# Patient Record
Sex: Male | Born: 1945 | Race: Black or African American | Hispanic: No | Marital: Married | Smoking: Current every day smoker
Health system: Southern US, Community
[De-identification: ages and names within clinical notes are randomized; demographics above are authoritative.]

## PROBLEM LIST (undated history)

## (undated) DIAGNOSIS — J45909 Unspecified asthma, uncomplicated: Secondary | ICD-10-CM

## (undated) DIAGNOSIS — Z8489 Family history of other specified conditions: Secondary | ICD-10-CM

## (undated) DIAGNOSIS — R042 Hemoptysis: Secondary | ICD-10-CM

## (undated) HISTORY — PX: NO PAST SURGERIES: SHX2092

## (undated) HISTORY — DX: Unspecified asthma, uncomplicated: J45.909

---

## 2017-10-17 ENCOUNTER — Other Ambulatory Visit: Payer: Self-pay | Admitting: Internal Medicine

## 2017-10-17 ENCOUNTER — Ambulatory Visit
Admission: RE | Admit: 2017-10-17 | Discharge: 2017-10-17 | Disposition: A | Discharge status: Home / Self Care | Payer: Self-pay | Source: Ambulatory Visit | Attending: Internal Medicine | Admitting: Internal Medicine

## 2017-10-17 DIAGNOSIS — R7612 Nonspecific reaction to cell mediated immunity measurement of gamma interferon antigen response without active tuberculosis: Secondary | ICD-10-CM

## 2017-10-27 NOTE — Congregational Nurse Program (Signed)
Congregational Nurse Program Note  Date of Encounter: 10/27/2017  Past Medical History: No past medical history on file.  Encounter Details: CNP Questionnaire - 10/27/17 1422      Questionnaire   Patient Status  Refugee    Race  African    Location Patient Served At  Not Applicable    Insurance  Medicaid;Not Applicable    Food  No food insecurities    Housing/Utilities  Yes, have permanent housing    Transportation  Yes, need transportation assistance    Interpersonal Safety  Yes, feel physically and emotionally safe where you currently live    Medication  No medication insecurities    Medical Provider  Yes    Referrals  Not Applicable    ED Visit Averted  Not Applicable    Life-Saving Intervention Made  Not Applicable     Client came to the office to familiarize with congregational nurse and services offered. Mr Claudy has been in the [place] for a few months. He is complaining of a cough. He has history of Asthma and everyday tobacco user.He has been educated on asthma and tobacco use. Next appointment is scheduled for January 22nd at [facility]. Earlie Server Muhoro Rn BSN PCCN CNP  [phone]

## 2017-10-31 ENCOUNTER — Ambulatory Visit (INDEPENDENT_AMBULATORY_CARE_PROVIDER_SITE_OTHER): Payer: Medicaid Other | Admitting: Family

## 2017-10-31 ENCOUNTER — Ambulatory Visit: Payer: Medicaid Other | Attending: Nurse Practitioner | Admitting: Nurse Practitioner

## 2017-10-31 ENCOUNTER — Encounter: Payer: Self-pay | Admitting: Family

## 2017-10-31 DIAGNOSIS — R7612 Nonspecific reaction to cell mediated immunity measurement of gamma interferon antigen response without active tuberculosis: Secondary | ICD-10-CM

## 2017-10-31 NOTE — Assessment & Plan Note (Signed)
Todd Duncan presents with symptoms and imaging that are concerning for active pulmonary tuberculosis. He is a refugee from [place] which places him at high risk. He is instructed to wear a mask and we will collect 3 AFB sputum cultures to confirm diagnosis prior to treatment. He was advised if his symptoms worsen or he develops further fevers or night sweats he may need to be admitted to the hospital. He will plan to follow up in 1 week.

## 2017-10-31 NOTE — Progress Notes (Signed)
erroneous  This encounter was created in error - please disregard.

## 2017-10-31 NOTE — Patient Instructions (Addendum)
Nice to meet you!  Please continue to wear the mask until we have completed the lab work.   Please collect the sputum cultures one per day - in the morning if possible.   Please bring the sputum cultures to this office.  If unable to collect samples please let us know.  We will plan to see him back in about 1 week.   Tuberculosis Tuberculosis (TB) is an infection that harms body tissues. TB usually affects your lungs but can affect other parts of your body. There are two stages of TB:  Active TB. This means you have the symptoms of TB, and it can easily spread from person to person (contagious).  Latent TB. This means you do not have any symptoms of TB, and you are not contagious.  It is important to get treatment no matter what type of TB you have. Follow these instructions at home:  Take your antibiotic medicine as told by your doctor. Finish it all even if you start to feel better.  Keep all follow-up visits as told by your doctor. This is important.  Tell your doctor about all of the people you live with or with whom you have close contact. Your doctor or the Department of Health may talk to these people about being tested for TB.  Rest as needed.  Do not use any tobacco products, including cigarettes, chewing tobacco, or electronic cigarettes. If you need help quitting, ask your doctor.  Avoid close contact with others, especially infants and older people. Do this until your doctor says you will no longer spread TB.  Cover your mouth and nose when you cough or sneeze. Get rid of used tissues as told by your doctor.  Wash your hands often with soap and water.  Do not go back to work or school until your doctor says it is okay. Contact a doctor if:  You have new symptoms.  You are not hungry.  You feel sick to your stomach (nauseous) or throw up (vomit).  Your pee (urine) is dark yellow.  Your skin or the white part of your eyes turns a yellowish color.  Your  symptoms do not go away or get worse.  You have a fever. Get help right away if:  You have chest pain.  You cough up blood.  You have trouble breathing or feel short of breath.  You have a headache or stiff neck. This information is not intended to replace advice given to you by your health care provider. Make sure you discuss any questions you have with your health care provider. Document Released: 07/24/2009 Document Revised: 05/29/2016 Document Reviewed: 01/01/2014 Elsevier Interactive Patient Education  2018 [employer].

## 2017-10-31 NOTE — Progress Notes (Signed)
Subjective:    Patient ID: Todd Duncan, male    DOB: [birth year], 72 y.o.   MRN: [id number]  Chief Complaint  Patient presents with  . New Patient (Initial Visit)    TB     HPI:  Todd Duncan is a 72 year old male presenting today for positive Quantiferon Gold blood test and chest x-ray concerning for pulmonary tuberculosis.   Todd Duncan is a refugee from [place] in [place] who arrived into to the [place] in November of 2018. Todd Duncan was in usual state of health until approximately 2 months ago when Todd Duncan developed a cough. The cough is described as productive at times. Todd Duncan has had a couple of episodes of fever and night sweats over the course of the past month but not daily. Todd Duncan has had about 7 pounds of unintentional weight loss since the cough started. Was seen in the [facility] and [facility] following a positive Quantiferon Gold blood test. A follow up chest x-ray showed findings hight suspicious for pulmonary tuberculosis in bilateral upper lobes with unknown acuity; with hyperinflation and emphysematous changes with suspected large bulla in the periphery of both lungs. Todd Duncan was referred to ID for potential active TB.   No Known Allergies    Outpatient Medications Prior to Visit  Medication Sig Dispense Refill  . albuterol (PROVENTIL HFA;VENTOLIN HFA) 108 (90 Base) MCG/ACT inhaler Inhale into the lungs every 6 (six) hours as needed for wheezing or shortness of breath.     No facility-administered medications prior to visit.       History reviewed. No pertinent surgical history.   Past Medical History:  Diagnosis Date  . Asthma     Review of Systems  Constitutional: Positive for chills, fever and unexpected weight change.  HENT: Positive for congestion. Negative for facial swelling, rhinorrhea, sinus pressure, sinus pain, sneezing, sore throat and tinnitus.   Respiratory: Positive for cough. Negative for chest tightness,  shortness of breath and wheezing.   Cardiovascular: Negative for chest pain, palpitations and leg swelling.  Neurological: Negative for weakness and headaches.      Objective:    BP 122/72   Pulse 82   Temp (!) 97.4 F (36.3 C) (Oral)  Nursing note and vital signs reviewed.  Physical Exam  Constitutional: Todd Duncan is oriented to person, place, and time. Todd Duncan appears well-developed and well-nourished. Todd Duncan appears cachectic. No distress.  HENT:  Right Ear: Hearing, tympanic membrane, external ear and ear canal normal.  Left Ear: Hearing, tympanic membrane, external ear and ear canal normal.  Nose: Nose normal.  Cardiovascular: Normal rate, regular rhythm, normal heart sounds and intact distal pulses. Exam reveals no gallop and no friction rub.  No murmur heard. Pulmonary/Chest: Effort normal and breath sounds normal. No respiratory distress. Todd Duncan has no wheezes. Todd Duncan has no rales. Todd Duncan exhibits no tenderness.  Abdominal: Soft. Bowel sounds are normal. Todd Duncan exhibits no distension.  Neurological: Todd Duncan is alert and oriented to person, place, and time.  Skin: Skin is warm and dry.  Psychiatric: Todd Duncan has a normal mood and affect. His behavior is normal. Judgment and thought content normal.       Assessment & Plan:   Problem List Items Addressed This Visit      Other   Positive QuantiFERON-TB Gold test    Todd Duncan presents with symptoms and imaging that are concerning for active pulmonary tuberculosis. Todd Duncan is a refugee from [place] which places him at high risk. Todd Duncan  is instructed to wear a mask and we will collect 3 AFB sputum cultures to confirm diagnosis prior to treatment. Todd Duncan was advised if his symptoms worsen or Todd Duncan develops further fevers or night sweats Todd Duncan may need to be admitted to the hospital. Todd Duncan will plan to follow up in 1 week.       Relevant Orders    MYCOBACTERIA, CULTURE, WITH FLUOROCHROME SMEAR    MYCOBACTERIA, CULTURE, WITH FLUOROCHROME SMEAR    MYCOBACTERIA, CULTURE, WITH FLUOROCHROME  SMEAR      I am having Todd Duncan maintain his albuterol.   Follow-up: Return in about 1 week (around 11/07/2017).   [name], MSN, [facility] for Infectious Disease

## 2017-11-03 ENCOUNTER — Telehealth: Payer: Self-pay | Admitting: *Deleted

## 2017-11-03 NOTE — Telephone Encounter (Signed)
Marlowe Kays from the [employer] called, asked if patient's test results are back and if he is considered contagious/has active pulmonary tuberculosis.  RN advised that she would have to page the provider, as the patient did not yet bring back sputum cultures ordered by Marya Amsler. Marlowe Kays persisted, "I just want an answer if he is contagious or not for the safety of myself and my coworkers."  RN again advised that she would have to speak with Marya Amsler, as I could not answer her questions based on the notes/labs available. RN gave Marlowe Kays the provider's pager. Landis Gandy, RN

## 2017-11-03 NOTE — Telephone Encounter (Signed)
Noted and spoke with her.

## 2017-11-07 ENCOUNTER — Ambulatory Visit: Payer: Medicaid Other | Admitting: Family

## 2018-01-03 ENCOUNTER — Encounter (HOSPITAL_COMMUNITY): Payer: Self-pay | Admitting: Family Medicine

## 2018-01-03 ENCOUNTER — Other Ambulatory Visit: Payer: Self-pay

## 2018-01-03 ENCOUNTER — Ambulatory Visit (HOSPITAL_COMMUNITY)
Admission: EM | Admit: 2018-01-03 | Discharge: 2018-01-03 | Disposition: A | Discharge status: Home / Self Care | Payer: Medicaid Other | Attending: Family Medicine | Admitting: Family Medicine

## 2018-01-03 DIAGNOSIS — R059 Cough, unspecified: Secondary | ICD-10-CM

## 2018-01-03 DIAGNOSIS — J4521 Mild intermittent asthma with (acute) exacerbation: Secondary | ICD-10-CM

## 2018-01-03 DIAGNOSIS — R05 Cough: Secondary | ICD-10-CM

## 2018-01-03 MED ORDER — PREDNISONE 10 MG (21) PO TBPK: ORAL_TABLET | Freq: Every day | ORAL | 0 refills | Status: DC

## 2018-01-03 NOTE — ED Triage Notes (Signed)
Interpreter # [phone] Todd Duncan. Pt speaks swaili. C/o Hx of asthma with cough onset at "beginning of the month"

## 2018-01-03 NOTE — Congregational Nurse Program (Signed)
Congregational Nurse Program Note  Date of Encounter: 01/03/2018  Past Medical History: Past Medical History:  Diagnosis Date  . Asthma     Encounter Details: CNP Questionnaire - 01/03/18 1030      Questionnaire   Patient Status  Refugee    Race  African    Insurance  Medicaid    Uninsured  Not Applicable    Food  No food insecurities    Housing/Utilities  Yes, have permanent housing    Transportation  Yes, need transportation assistance    Interpersonal Safety  Yes, feel physically and emotionally safe where you currently live    Medication  Yes, have medication insecurities    Medical Provider  No    Referrals  Urgent Care    ED Visit Averted  Not Applicable    Life-Saving Intervention Made  Not Applicable      Office visit for this [place] man speaking [place] and including Swahili and [place], but no [place]. [employer] used. Very thin body frame. Initial concern was for eye exam and reading glasses. Vision 10/25 OU with 10 ft office chart. Reading glasses provided  Constantly coughing with rattling productive cough. Decreased lung sounds on right. History of latent TB previously managed at [facility] and due for a follow-up appointment now. Smokes several cigarettes per day. Unwilling to cease smoking. Refer to [facility] now and transportation provided by taxi.  Message left for appointment and follow-up with TB nurse at [facility], [phone]. Follow-up with nurse on Tuesday at [place]. Jannetta Quint, RN/CN

## 2018-01-09 NOTE — Congregational Nurse Program (Signed)
Congregational Nurse Program Note  Date of Encounter: 01/09/2018  Past Medical History: Past Medical History:  Diagnosis Date  . Asthma     Encounter Details: CNP Questionnaire - 01/09/18 1409      Questionnaire   Referrals  Other      Brief office visit and follow-up after referral to [facility]. Attending English classes at [employer].  Reviewed method of taking Prednisone descending dosage daily and use of inhaler. Very alert and attentive to information. Frequent coughing this am with production of mucous and rattling sound. Rhonchi of upper chest and bronchial area. Decreased lung sounds of upper right lung and normal left sounds. Reports productive cough during sleep also. Dressed very light for cold temperature outside and without a coat. Recommended dressing warmer. Assist securing warmer clothing through agency social worker. Information provided regarding appointment with [facility] TB [place] on 01-11-18 at 2:00 pm. Return to [place] nurse office on 01-20-18 to assist with transportation. Jannetta Quint, RN/CN

## 2018-01-11 NOTE — ED Provider Notes (Signed)
[place]   [id number] 01/03/18 Arrival Time: 2426  ASSESSMENT & PLAN:  1. Mild intermittent asthma with exacerbation   2. Cough    Nebulizer treatment needed: no.  Meds ordered this encounter  Medications  . predniSONE (STERAPRED UNI-PAK 21 TAB) 10 MG (21) TBPK tablet    Sig: Take by mouth daily. Take as directed.    Dispense:  21 tablet    Refill:  0   Asthma precautions given. OTC symptom care as needed. May f/u with PCP or here as needed.  Reviewed expectations re: course of current medical issues. Questions answered. Outlined signs and symptoms indicating need for more acute intervention. Patient verbalized understanding. After Visit Summary given.  SUBJECTIVE: History from: patient.  Todd Duncan is a 72 y.o. male who presents with complaint of intermittent wheezing. Triggers: None. Onset gradual, approximately on/off over the past few weeks. Describes wheezing as mild when present. Fever: no. Overall normal PO intake without n/v. Sick contacts: no. Typically his asthma is well controlled. Inhaler use: occasional. OTC treatment: none. No CP.  Social History   Tobacco Use  Smoking Status Former Smoker  Smokeless Tobacco Never Used    ROS: As per HPI.   OBJECTIVE:  Vitals:   01/03/18 1236  BP: 111/81  Pulse: 72  SpO2: 98%     General appearance: alert; appears fatigued HEENT: nasal congestion; clear runny nose; throat irritation secondary to post-nasal drainage Neck: supple without LAD Lungs: unlabored respirations, mild bilateral wheezing; cough: mild; no significant respiratory distress Skin: warm and dry Psychological: alert and cooperative; normal mood and affect   No Known Allergies  Past Medical History:  Diagnosis Date  . Asthma    No family history on file. Social History   Socioeconomic History  . Marital status: Married    Spouse name: Not on file  . Number of children: 10  . Years of education: Not on file  .  Highest education level: Not on file  Occupational History  . Not on file  Social Needs  . Financial resource strain: Not on file  . Food insecurity:    Worry: Never true    Inability: Never true  . Transportation needs:    Medical: Not on file    Non-medical: Not on file  Tobacco Use  . Smoking status: Former Research scientist (life sciences)  . Smokeless tobacco: Never Used  Substance and Sexual Activity  . Alcohol use: Yes    Frequency: Never    Comment: Occasional  . Drug use: No  . Sexual activity: Not on file  Lifestyle  . Physical activity:    Days per week: Not on file    Minutes per session: Not on file  . Stress: Not on file  Relationships  . Social connections:    Talks on phone: Not on file    Gets together: Not on file    Attends religious service: Not on file    Active member of club or organization: Not on file    Attends meetings of clubs or organizations: Not on file    Relationship status: Not on file  . Intimate partner violence:    Fear of current or ex partner: Not on file    Emotionally abused: Not on file    Physically abused: Not on file    Forced sexual activity: Not on file  Other Topics Concern  . Not on file  Social History Narrative  . Not on file     Vanessa Kick, MD  01/11/18 0916  

## 2018-02-20 NOTE — Congregational Nurse Program (Signed)
Congregational Nurse Program Note  Date of Encounter: 02/13/2018  Past Medical History: Past Medical History:  Diagnosis Date  . Asthma     Encounter Details: CNP Questionnaire - 02/13/18 1100      Questionnaire   Patient Status  Refugee    Race  African    Location Patient Served At  Not Applicable    Insurance  Medicaid    Uninsured  Not Applicable    Food  No food insecurities    Housing/Utilities  Yes, have permanent housing    Transportation  Yes, need transportation assistance    Interpersonal Safety  Yes, feel physically and emotionally safe where you currently live    Medication  Yes, have medication insecurities    Medical Provider  No    Referrals  Other    ED Visit Averted  Not Applicable    Life-Saving Intervention Made  Not Applicable      Brief office requesting information regarding immunization status for routine vaccinations. Also complained of pain in left lower back above hip. Afebrile. Currently treated for Latent Tuberculosis and admits to compliance with medication through [facility] clinic. General appearance is a very thin man recent from [place] as refuge. Very pleasant and knowlegeable communicating in [place] or Swahil. Confirmed with [place] clinic all vaccinations current. Eligible to apply for [employer] on August 24, 2018. Medicaid information unavailable today. Will bring card on next day to determine PCP assigned. Jannetta Quint, RN/CN

## 2018-03-07 ENCOUNTER — Ambulatory Visit (INDEPENDENT_AMBULATORY_CARE_PROVIDER_SITE_OTHER): Payer: Medicaid Other

## 2018-03-07 ENCOUNTER — Other Ambulatory Visit: Payer: Self-pay

## 2018-03-07 ENCOUNTER — Encounter (HOSPITAL_COMMUNITY): Payer: Self-pay | Admitting: Emergency Medicine

## 2018-03-07 ENCOUNTER — Ambulatory Visit (HOSPITAL_COMMUNITY)
Admission: EM | Admit: 2018-03-07 | Discharge: 2018-03-07 | Disposition: A | Discharge status: Home / Self Care | Payer: Medicaid Other | Attending: Family Medicine | Admitting: Family Medicine

## 2018-03-07 DIAGNOSIS — R0781 Pleurodynia: Secondary | ICD-10-CM

## 2018-03-07 DIAGNOSIS — K5900 Constipation, unspecified: Secondary | ICD-10-CM | POA: Diagnosis not present

## 2018-03-07 DIAGNOSIS — M25512 Pain in left shoulder: Secondary | ICD-10-CM

## 2018-03-07 MED ORDER — NAPROXEN 500 MG PO TABS: 500.0000 mg | ORAL_TABLET | Freq: Two times a day (BID) | ORAL | 0 refills | Status: DC
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
## 2018-03-07 NOTE — Congregational Nurse Program (Signed)
Congregational Nurse Program Note  Date of Encounter: 03/07/2018  Past Medical History: Past Medical History:  Diagnosis Date  . Asthma     Encounter Details: CNP Questionnaire - 03/07/18 1145      Questionnaire   Patient Status  Refugee    Race  African    Location Patient Served At  [place]  Medicaid    Uninsured  Not Applicable    Food  No food insecurities    Housing/Utilities  Yes, have permanent housing    Transportation  Yes, need transportation assistance    Interpersonal Safety  Yes, feel physically and emotionally safe where you currently live    Medication  No medication insecurities    Medical Provider  No    Referrals  Urgent Care    ED Visit Averted  Not Applicable    Life-Saving Intervention Made  Not Applicable       Office visit at [place] center for this [place] man in need of [place] or Swahili interpretation. Preference is Swahili. Complains of severe left sided pain in left ribs area and in left shoulder upon pressure or movement since three days. States "I feel like I was hit in the chest". " I feel weak". Denies history of related injury. Experiences pain in shoulder upon pressure at site. Left lung sounds decreased with respirations;congested sounds. Right lung sounds normal. Frequent non-productive coughing. Decreased coughing since last visit.  Very thin man with visible rib cage outline without shirt on. Weight decrease since original visit. Currently under treatment and followed by [facility] for LT; [phone]. Next clinic appointment per patient is 6/19. Medicaid information unavailable today for PCP referral; advised to return to nurse office to coordinate PCP appointment on 03/14/18 with Medicaid information. Jannetta Quint, RN/CN. [phone].
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
## 2018-03-27 NOTE — Congregational Nurse Program (Signed)
Congregational Nurse Program Note  Date of Encounter: 03/13/2018  Past Medical History: Past Medical History:  Diagnosis Date  . Asthma     Encounter Details: CNP Questionnaire - 03/13/18 1000      Questionnaire   Patient Status  Refugee    Race  African    Location Patient Served At  [place]  Medicaid    Uninsured  Not Applicable    Food  No food insecurities    Housing/Utilities  Yes, have permanent housing    Transportation  Yes, need transportation assistance    Interpersonal Safety  Yes, feel physically and emotionally safe where you currently live    Medication  No medication insecurities    Medical Provider  No    Referrals  Other    ED Visit Averted  Not Applicable    Life-Saving Intervention Made  Not Applicable       Brief visit for this non-English speaking man from the [place], communicating in [place] and [place].  Currently under treatment for chest muscle soreness. Reports taking Aleve as ordered and pain improving with medicine. 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 Referred to [facility] for evaluation and medication refill. Understands use of [employer] [facility] for [place]. Provided bus tickets. Return prn. Jannetta Quint, RN/CN

## 2018-04-07 ENCOUNTER — Other Ambulatory Visit: Payer: Self-pay

## 2018-04-07 ENCOUNTER — Emergency Department (HOSPITAL_COMMUNITY): Payer: Medicaid Other

## 2018-04-07 ENCOUNTER — Encounter (HOSPITAL_COMMUNITY): Payer: Self-pay | Admitting: Emergency Medicine

## 2018-04-07 ENCOUNTER — Inpatient Hospital Stay (HOSPITAL_COMMUNITY)
Admission: EM | Admit: 2018-04-07 | Discharge: 2018-04-13 | DRG: 167 | Disposition: A | Discharge status: Home / Self Care | Payer: Medicaid Other | Attending: Family Medicine | Admitting: Family Medicine

## 2018-04-07 DIAGNOSIS — D1809 Hemangioma of other sites: Secondary | ICD-10-CM | POA: Diagnosis not present

## 2018-04-07 DIAGNOSIS — E440 Moderate protein-calorie malnutrition: Secondary | ICD-10-CM | POA: Diagnosis present

## 2018-04-07 DIAGNOSIS — Z7952 Long term (current) use of systemic steroids: Secondary | ICD-10-CM | POA: Diagnosis not present

## 2018-04-07 DIAGNOSIS — R634 Abnormal weight loss: Secondary | ICD-10-CM | POA: Diagnosis not present

## 2018-04-07 DIAGNOSIS — R627 Adult failure to thrive: Secondary | ICD-10-CM | POA: Diagnosis not present

## 2018-04-07 DIAGNOSIS — A1801 Tuberculosis of spine: Secondary | ICD-10-CM | POA: Diagnosis not present

## 2018-04-07 DIAGNOSIS — J189 Pneumonia, unspecified organism: Secondary | ICD-10-CM

## 2018-04-07 DIAGNOSIS — D649 Anemia, unspecified: Secondary | ICD-10-CM | POA: Diagnosis not present

## 2018-04-07 DIAGNOSIS — E871 Hypo-osmolality and hyponatremia: Secondary | ICD-10-CM | POA: Diagnosis present

## 2018-04-07 DIAGNOSIS — Z79899 Other long term (current) drug therapy: Secondary | ICD-10-CM

## 2018-04-07 DIAGNOSIS — R918 Other nonspecific abnormal finding of lung field: Secondary | ICD-10-CM | POA: Diagnosis not present

## 2018-04-07 DIAGNOSIS — A319 Mycobacterial infection, unspecified: Secondary | ICD-10-CM | POA: Diagnosis present

## 2018-04-07 DIAGNOSIS — J4530 Mild persistent asthma, uncomplicated: Secondary | ICD-10-CM | POA: Diagnosis present

## 2018-04-07 DIAGNOSIS — M940 Chondrocostal junction syndrome [Tietze]: Secondary | ICD-10-CM | POA: Diagnosis present

## 2018-04-07 DIAGNOSIS — R7612 Nonspecific reaction to cell mediated immunity measurement of gamma interferon antigen response without active tuberculosis: Secondary | ICD-10-CM | POA: Diagnosis not present

## 2018-04-07 DIAGNOSIS — Z720 Tobacco use: Secondary | ICD-10-CM

## 2018-04-07 DIAGNOSIS — R042 Hemoptysis: Secondary | ICD-10-CM | POA: Diagnosis not present

## 2018-04-07 DIAGNOSIS — F17210 Nicotine dependence, cigarettes, uncomplicated: Secondary | ICD-10-CM | POA: Diagnosis present

## 2018-04-07 DIAGNOSIS — J440 Chronic obstructive pulmonary disease with acute lower respiratory infection: Secondary | ICD-10-CM | POA: Diagnosis present

## 2018-04-07 DIAGNOSIS — Z9889 Other specified postprocedural states: Secondary | ICD-10-CM

## 2018-04-07 DIAGNOSIS — J471 Bronchiectasis with (acute) exacerbation: Secondary | ICD-10-CM | POA: Diagnosis present

## 2018-04-07 DIAGNOSIS — J441 Chronic obstructive pulmonary disease with (acute) exacerbation: Secondary | ICD-10-CM | POA: Diagnosis present

## 2018-04-07 DIAGNOSIS — J181 Lobar pneumonia, unspecified organism: Secondary | ICD-10-CM | POA: Diagnosis not present

## 2018-04-07 DIAGNOSIS — Z8611 Personal history of tuberculosis: Secondary | ICD-10-CM | POA: Diagnosis not present

## 2018-04-07 DIAGNOSIS — R05 Cough: Secondary | ICD-10-CM | POA: Diagnosis not present

## 2018-04-07 DIAGNOSIS — A159 Respiratory tuberculosis unspecified: Secondary | ICD-10-CM | POA: Diagnosis not present

## 2018-04-07 DIAGNOSIS — J45909 Unspecified asthma, uncomplicated: Secondary | ICD-10-CM | POA: Diagnosis present

## 2018-04-07 HISTORY — DX: Hemoptysis: R04.2

## 2018-04-07 HISTORY — DX: Family history of other specified conditions: Z84.89

## 2018-04-07 LAB — COMPREHENSIVE METABOLIC PANEL
ALK PHOS: 56 U/L (ref 38–126)
ALT: 11 U/L (ref 0–44)
ANION GAP: 8 (ref 5–15)
AST: 18 U/L (ref 15–41)
Albumin: 2.7 g/dL — ABNORMAL LOW (ref 3.5–5.0)
BUN: 11 mg/dL (ref 8–23)
CALCIUM: 8.4 mg/dL — AB (ref 8.9–10.3)
CO2: 23 mmol/L (ref 22–32)
CREATININE: 0.85 mg/dL (ref 0.61–1.24)
Chloride: 98 mmol/L (ref 98–111)
Glucose, Bld: 97 mg/dL (ref 70–99)
Potassium: 4.1 mmol/L (ref 3.5–5.1)
Sodium: 129 mmol/L — ABNORMAL LOW (ref 135–145)
Total Bilirubin: 0.9 mg/dL (ref 0.3–1.2)
Total Protein: 7.9 g/dL (ref 6.5–8.1)

## 2018-04-07 LAB — BRAIN NATRIURETIC PEPTIDE: B Natriuretic Peptide: 105.5 pg/mL — ABNORMAL HIGH (ref 0.0–100.0)

## 2018-04-07 LAB — CBC WITH DIFFERENTIAL/PLATELET
ABS IMMATURE GRANULOCYTES: 0.1 10*3/uL (ref 0.0–0.1)
BASOS PCT: 0 %
Basophils Absolute: 0.1 10*3/uL (ref 0.0–0.1)
Eosinophils Absolute: 0.1 10*3/uL (ref 0.0–0.7)
Eosinophils Relative: 1 %
HCT: 34.7 % — ABNORMAL LOW (ref 39.0–52.0)
Hemoglobin: 10.9 g/dL — ABNORMAL LOW (ref 13.0–17.0)
Immature Granulocytes: 0 %
Lymphocytes Relative: 9 %
Lymphs Abs: 1 10*3/uL (ref 0.7–4.0)
MCH: 29.3 pg (ref 26.0–34.0)
MCHC: 31.4 g/dL (ref 30.0–36.0)
MCV: 93.3 fL (ref 78.0–100.0)
MONO ABS: 1.3 10*3/uL — AB (ref 0.1–1.0)
MONOS PCT: 12 %
NEUTROS ABS: 8.7 10*3/uL — AB (ref 1.7–7.7)
Neutrophils Relative %: 78 %
PLATELETS: 280 10*3/uL (ref 150–400)
RBC: 3.72 MIL/uL — ABNORMAL LOW (ref 4.22–5.81)
RDW: 13.5 % (ref 11.5–15.5)
WBC: 11.2 10*3/uL — ABNORMAL HIGH (ref 4.0–10.5)

## 2018-04-07 LAB — RAPID HIV SCREEN (HIV 1/2 AB+AG)
HIV 1/2 Antibodies: NONREACTIVE
HIV-1 P24 ANTIGEN - HIV24: NONREACTIVE

## 2018-04-07 LAB — PROTIME-INR
INR: 1.21
Prothrombin Time: 15.2 seconds (ref 11.4–15.2)

## 2018-04-07 LAB — I-STAT CG4 LACTIC ACID, ED: Lactic Acid, Venous: 0.85 mmol/L (ref 0.5–1.9)

## 2018-04-07 LAB — TROPONIN I: Troponin I: <0.03 ng/mL (ref <0.03)

## 2018-04-07 LAB — APTT: APTT: 37 s — AB (ref 24–36)

## 2018-04-07 MED ORDER — SODIUM CHLORIDE 0.9 % IV SOLN
1.0000 g | Freq: Once | INTRAVENOUS | Status: AC
  Administered 2018-04-07: 1 g via INTRAVENOUS
  Filled 2018-04-07: qty 10

## 2018-04-07 MED ORDER — ENOXAPARIN SODIUM 40 MG/0.4ML ~~LOC~~ SOLN
40.0000 mg | SUBCUTANEOUS | Status: DC
  Administered 2018-04-07 – 2018-04-12 (×6): 40 mg via SUBCUTANEOUS
  Filled 2018-04-07 (×6): qty 0.4

## 2018-04-07 MED ORDER — SODIUM CHLORIDE 0.9 % IV SOLN
500.0000 mg | Freq: Once | INTRAVENOUS | Status: AC
  Administered 2018-04-07: 500 mg via INTRAVENOUS
  Filled 2018-04-07: qty 500

## 2018-04-07 MED ORDER — IOHEXOL 300 MG/ML  SOLN
75.0000 mL | Freq: Once | INTRAMUSCULAR | Status: AC | PRN
  Administered 2018-04-07: 100 mL via INTRAVENOUS

## 2018-04-07 MED ORDER — SODIUM CHLORIDE 0.9 % IV SOLN
INTRAVENOUS | Status: DC
  Administered 2018-04-07 – 2018-04-13 (×13): via INTRAVENOUS

## 2018-04-07 MED ORDER — METHYLPREDNISOLONE SODIUM SUCC 125 MG IJ SOLR
125.0000 mg | Freq: Once | INTRAMUSCULAR | Status: AC
  Administered 2018-04-07: 125 mg via INTRAVENOUS
  Filled 2018-04-07: qty 2

## 2018-04-07 MED ORDER — ACETAMINOPHEN 325 MG PO TABS
650.0000 mg | ORAL_TABLET | Freq: Four times a day (QID) | ORAL | Status: DC
  Administered 2018-04-08 – 2018-04-13 (×22): 650 mg via ORAL
  Filled 2018-04-07 (×22): qty 2

## 2018-04-07 MED ORDER — ALBUTEROL SULFATE (2.5 MG/3ML) 0.083% IN NEBU
5.0000 mg | INHALATION_SOLUTION | Freq: Once | RESPIRATORY_TRACT | Status: AC
  Administered 2018-04-07: 5 mg via RESPIRATORY_TRACT
  Filled 2018-04-07: qty 6

## 2018-04-07 MED ORDER — SODIUM CHLORIDE 0.9 % IV BOLUS
500.0000 mL | Freq: Once | INTRAVENOUS | Status: AC
  Administered 2018-04-07: 500 mL via INTRAVENOUS

## 2018-04-07 MED ORDER — IBUPROFEN 200 MG PO TABS
200.0000 mg | ORAL_TABLET | Freq: Four times a day (QID) | ORAL | Status: DC
  Administered 2018-04-07 – 2018-04-12 (×21): 200 mg via ORAL
  Filled 2018-04-07 (×21): qty 1

## 2018-04-07 NOTE — ED Provider Notes (Signed)
[facility] EMERGENCY DEPARTMENT Provider Note   CSN: [id number] Arrival date & time: 04/07/18  1302     History   Chief Complaint Chief Complaint  Patient presents with  . Epistaxis  . Chest Pain    HPI Todd Duncan is a 72 y.o. male.  HPI Patient was originally from [place] and most recently moved from [place] 7 months ago presents with 3 weeks of coughing, right-sided rib pain, subjective fevers and chills, weight loss, shortness of breath.  States he has had blood in the sputum as well as episodic nosebleeds from the right nare for the past 3 days.  Patient is not aware of any of status.  States he has been to see infectious disease doctor regarding positive TB testing but was told he does not have active tuberculosis. Past Medical History:  Diagnosis Date  . Asthma     Patient Active Problem List   Diagnosis Date Noted  . 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 No pertinent surgical history.      Home Medications    Prior to Admission medications   Medication Sig Start Date End Date Taking? Authorizing Provider  albuterol (PROVENTIL HFA;VENTOLIN HFA) 108 (90 Base) MCG/ACT inhaler Inhale into the lungs every 6 (six) hours as needed for wheezing or shortness of breath.    [provider]  naproxen (NAPROSYN) 500 MG tablet Take 1 tablet (500 mg total) by mouth 2 (two) times daily. 03/07/18   Vanessa Kick, MD  predniSONE (STERAPRED UNI-PAK 21 TAB) 10 MG (21) TBPK tablet Take by mouth daily. Take as directed. 01/03/18   Vanessa Kick, MD    Family History Family History  Family history unknown: Yes    Social History Social History   Tobacco Use  . Smoking status: Former Research scientist (life sciences)  . Smokeless tobacco: Never Used  Substance Use Topics  . Alcohol use: Yes    Frequency: Never    Comment: Occasional  . Drug use: No     Allergies   Patient has no known allergies.   Review of Systems Review of Systems    Constitutional: Positive for chills, fatigue and fever.  HENT: Positive for nosebleeds. Negative for sore throat and trouble swallowing.   Eyes: Negative for visual disturbance.  Respiratory: Positive for cough, shortness of breath and wheezing.   Cardiovascular: Positive for chest pain. Negative for palpitations and leg swelling.  Gastrointestinal: Negative for abdominal pain, blood in stool, diarrhea, nausea and vomiting.  Genitourinary: Negative for dysuria, frequency and hematuria.  Musculoskeletal: Negative for back pain, myalgias, neck pain and neck stiffness.  Skin: Negative for rash and wound.  Neurological: Negative for dizziness, weakness, light-headedness, numbness and headaches.  All other systems reviewed and are negative.    Physical Exam Updated Vital Signs BP 112/70 (BP Location: Left Arm)   Pulse 87   Temp 97.7 F (36.5 C) (Oral)   Resp 18   SpO2 98%   Physical Exam  Constitutional: He is oriented to person, place, and time. He appears well-developed.  Cachectic appearing  HENT:  Head: Normocephalic and atraumatic.  Mouth/Throat: Oropharynx is clear and moist.  Dried blood in the right nare.  No obvious active bleeding.  Eyes: Pupils are equal, round, and reactive to light. EOM are normal.  Muddy sclera  Neck: Normal range of motion. Neck supple.  No meningismus.  No lymphadenopathy.  Cardiovascular: Normal rate and regular rhythm. Exam reveals no gallop and no friction rub.  No  murmur heard. Pulmonary/Chest: He has wheezes. He has rales.  Diminished breath sounds and rhonchi in the right base and left upper lobes.  Scattered expiratory wheezing.  Increased work of breathing.  Abdominal: Soft. Bowel sounds are normal. There is no tenderness. There is no rebound and no guarding.  Musculoskeletal: Normal range of motion. He exhibits no edema or tenderness.  No lower extremity swelling, asymmetry or tenderness.  Distal pulses intact.  Neurological: He is alert  and oriented to person, place, and time.  Moves all extremities without focal deficit.  Sensation intact.  Skin: Skin is warm and dry. Capillary refill takes less than 2 seconds. No rash noted. No erythema.  Psychiatric: He has a normal mood and affect. His behavior is normal.  Nursing note and vitals reviewed.    ED Treatments / Results  Labs (all labs ordered are listed, but only abnormal results are displayed) Labs Reviewed  CBC WITH DIFFERENTIAL/PLATELET - Abnormal; Notable for the following components:      Result Value   WBC 11.2 (*)    RBC 3.72 (*)    Hemoglobin 10.9 (*)    HCT 34.7 (*)    Neutro Abs 8.7 (*)    Monocytes Absolute 1.3 (*)    All other components within normal limits  COMPREHENSIVE METABOLIC PANEL - Abnormal; Notable for the following components:   Sodium 129 (*)    Calcium 8.4 (*)    Albumin 2.7 (*)    All other components within normal limits  BRAIN NATRIURETIC PEPTIDE - Abnormal; Notable for the following components:   B Natriuretic Peptide 105.5 (*)    All other components within normal limits  APTT - Abnormal; Notable for the following components:   aPTT 37 (*)    All other components within normal limits  CULTURE, BLOOD (ROUTINE X 2)  CULTURE, BLOOD (ROUTINE X 2)  TROPONIN I  PROTIME-INR  RAPID HIV SCREEN (HIV 1/2 AB+AG)  I-STAT CG4 LACTIC ACID, ED    EKG None  Radiology Ct Chest W Contrast  Result Date: 04/07/2018 CLINICAL DATA:  Productive cough with bloody sputum. History of tuberculosis. EXAM: CT CHEST WITH CONTRAST TECHNIQUE: Multidetector CT imaging of the chest was performed during intravenous contrast administration. CONTRAST:  137mL OMNIPAQUE IOHEXOL 300 MG/ML  SOLN COMPARISON:  Multiple prior chest x-rays, most recently from today. FINDINGS: Cardiovascular: Normal heart size. No pericardial effusion. Normal caliber thoracic aorta. No aortic dissection. No central pulmonary embolism. Mediastinum/Nodes: Borderline enlarged left hilar  and subcarinal lymph nodes, measuring 1.0 cm in short axis. No axillary lymphadenopathy. The thyroid gland, trachea, and esophagus demonstrate no significant findings. Lungs/Pleura: Volume loss in the left upper lobe. Bronchiectasis, architectural distortion, and bullous changes in both upper lobes. Prominent non-enhancing consolidation involving the majority of the left upper lobe with solid material filling the bullae at the left lung apex. Scattered peribronchovascular nodularity in the left lower lobe. Small right pleural effusion with right lower lobe ground-glass density and consolidation. Mild emphysema. Upper Abdomen: No acute abnormality. Musculoskeletal: No chest wall abnormality. No acute osseous findings. Small oval lucent lesion in the T3 vertebral body, nonspecific. IMPRESSION: 1. 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 2. Nonspecific 1 cm lucent lesion in the T3 vertebral body. Consider non-emergent bone scan or thoracic spine MRI with contrast for further evaluation. 3.  Emphysema (ICD10-J43.9). Electronically Signed   By: Orville Govern.D.  On: 04/07/2018 18:11   Dg Chest Portable 1 View  Result Date: 04/07/2018 CLINICAL DATA:  Persistent nose bleeds. Patient on latent TB medications. EXAM: PORTABLE CHEST 1 VIEW COMPARISON:  October 17, 2017 and Mar 07, 2018 FINDINGS: Cystic changes are seen in the apices, left greater than right. There is increased density over the left apex. There is a new infiltrate in the lateral right lung base. Volume loss on the left persists. No other interval changes. No pneumothorax. IMPRESSION: 1. New infiltrate in the lateral right lung base. 2. Cystic changes in the upper lobes correlate with the reported history of previous tuberculosis. 3. Increasing density in the left upper lobe could be positional or technical. A developing  infiltrate is not excluded. A PA and lateral chest x-ray could better evaluate. Electronically Signed   By: Dorise Bullion III M.D   On: 04/07/2018 14:07    Procedures Procedures (including critical care time)  Medications Ordered in ED Medications  azithromycin (ZITHROMAX) 500 mg in sodium chloride 0.9 % 250 mL IVPB (500 mg Intravenous New Bag/Given 04/07/18 1755)  albuterol (PROVENTIL) (2.5 MG/3ML) 0.083% nebulizer solution 5 mg (5 mg Nebulization Given 04/07/18 1550)  methylPREDNISolone sodium succinate (SOLU-MEDROL) 125 mg/2 mL injection 125 mg (125 mg Intravenous Given 04/07/18 1550)  sodium chloride 0.9 % bolus 500 mL (0 mLs Intravenous Stopped 04/07/18 1650)  cefTRIAXone (ROCEPHIN) 1 g in sodium chloride 0.9 % 100 mL IVPB (0 g Intravenous Stopped 04/07/18 1826)  iohexol (OMNIPAQUE) 300 MG/ML solution 75 mL (100 mLs Intravenous Contrast Given 04/07/18 1731)     Initial Impression / Assessment and Plan / ED Course  I have reviewed the triage vital signs and the nursing notes.  Pertinent labs & imaging results that were available during my care of the patient were reviewed by me and considered in my medical decision making (see chart for details).     Discussed with internal medicine residency service.  Will see patient in the emergency department and admit.  No active bleeding.  Patient does have bilateral pneumonia on x-ray with chronic changes of tuberculosis.  He is in a negative pressure room.  Final Clinical Impressions(s) / ED Diagnoses   Final diagnoses:  Multifocal pneumonia    ED Discharge Orders    None       Julianne Rice, MD 04/07/18 [phone]

## 2018-04-07 NOTE — ED Notes (Addendum)
Patient still in CT.

## 2018-04-07 NOTE — H&P (Addendum)
[facility] Admission History and Physical Service Pager: [phone]  Patient name: Todd Duncan Medical record number: [id number] Date of birth: 1946/03/09 Age: 72 y.o. Gender: male  Primary Care Provider: System, Pcp Not In Consultants: Infectious disease Code Status: Full  Chief Complaint: Chest wall pain and cough   Assessment and Plan: Todd Duncan is a 72 y.o. male presenting with R sided chest wall pain and cough productive of bloody sputum. PMH is significant for asthma and tobacco use.  Hemoptysis  possible active tuberculosis: Chest wall pain likely costochondritis secondary to frequent cough.  Fevers, chills, weight loss, cough productive of frank blood, and previous positive quantiferon gold TB test without treatment support diagnosis of active TB.  Vitals are wnl.  Exam positive for rhonchorous lung exam, thin appearance.  Labs significant for slight hyponatremia to 129, albumin of 2.7, WBC 11.2, Hgb 10.9.  CXR significant for new infiltrate in the lateral RLL, cystic changes in upper lobes, and increasing density in LUL.  CT chest with multilobar PNA in LUL and RLL with chronic changes in upper lobes consistent w/ chronic TB, 1 cm lucent lesion in T3 vertebral body, and emphysema.  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  Called Dr. Tommy Medal of ID to discuss patient, and he is concerned that patient has active TB.  He was supposed to follow up with ID after first seeing them in January 2019 but did not.  Language barrier could have contributed to this lack of follow up.  Was given ceftriaxone and azithromycin for presumed CAP in the ED. - admit to med-surg, attending Dr. Erin Hearing - airborne precautions - ID consulted, appreciate recommendations - f/u blood cultures - AFB smear of sputum - consider continuing CTX and azithromycin, may need to change therapy - tylenol 650 mg Q6H, ibuprofen 200 mg Q6H alternating for costochondritis (not PRN  d/t language barrier)  Hyponatremia: sodium of 129 on admission - NS @ 100 ml/hr  Emphysema: patient told he has asthma, but he likely has emphysema given changes seen on CXR and CT and tobacco use history. - may benefit from Spiriva on discharge  Tobacco use: patient smokes about 4 cigarettes per day.  Used to smoke more, has a 50 year smoking history. - nicotine patch on request  FEN/GI: regular diet Prophylaxis: lovenox  Disposition: home  History of Present Illness:  Todd Duncan is a 72 y.o. male presenting with fevers, poor appetite with weight loss, hemoptysis for the past three weeks.  He says that he has had hemoptysis since 2014 off and on but was told that he had a virus and not TB at that time.  He was seen in the ID clinic in January 2019 due to a positive quantiferon gold result and sputum cultures were ordered, but patient did not follow up.  He reports that he was told he had TB but did not need treatment at that time.  He was then seen in March in the ED and was told he had asthma.  Since his symptoms have worsened over the past three weeks, he decided to come to the ED today.    He lived in the [place] until 1996, when he immigrated to [place] as a refugee due to political unrest.  He arrived in the [place] seven months ago.  He speaks [place] and [place].  He smokes about four cigarettes per day and drinks beer occasionally on weekends, up to a six pack on the weekend.  Review Of Systems: Per HPI with the following additions:   Review of Systems  Constitutional: Positive for chills, fever, malaise/fatigue and weight loss.  HENT: Negative for congestion and sore throat.   Eyes: Negative for blurred vision and double vision.  Respiratory: Positive for cough, hemoptysis, sputum production, shortness of breath and wheezing.   Cardiovascular: Positive for chest pain. Negative for leg swelling.  Gastrointestinal: Negative for abdominal pain, blood in stool, diarrhea, nausea  and vomiting.  Genitourinary: Negative for dysuria and urgency.  Musculoskeletal: Negative for back pain, myalgias and neck pain.  Skin: Negative for itching and rash.  Neurological: Negative for dizziness, weakness and headaches.  Psychiatric/Behavioral: Negative for substance abuse. The patient is not nervous/anxious.     Patient Active Problem List   Diagnosis Date Noted  . Tuberculosis 04/07/2018  . 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 Asthma     Past Surgical History: History reviewed. No pertinent surgical history.  Social History: Social History   Tobacco Use  . Smoking status: Former Research scientist (life sciences)  . Smokeless tobacco: Never Used  Substance Use Topics  . Alcohol use: Yes    Frequency: Never    Comment: Occasional  . Drug use: No    Family History: Family History  Family history unknown: Yes    Allergies and Medications: No Known Allergies No current facility-administered medications on file prior to encounter.    Current Outpatient Medications on File Prior to Encounter  Medication Sig Dispense Refill  . albuterol (PROVENTIL HFA;VENTOLIN HFA) 108 (90 Base) MCG/ACT inhaler Inhale into the lungs every 6 (six) hours as needed for wheezing or shortness of breath.    . naproxen (NAPROSYN) 500 MG tablet Take 1 tablet (500 mg total) by mouth 2 (two) times daily. 30 tablet 0  . predniSONE (STERAPRED UNI-PAK 21 TAB) 10 MG (21) TBPK tablet Take by mouth daily. Take as directed. 21 tablet 0    Objective: BP 112/70 (BP Location: Left Arm)   Pulse 87   Temp 97.7 F (36.5 C) (Oral)   Resp 18   SpO2 98%  Physical Exam  Constitutional: He is oriented to person, place, and time. He appears ill. No distress.  Very thin  HENT:  Head: Normocephalic and atraumatic.  Eyes: Pupils are equal, round, and reactive to light. EOM are normal.  Cardiovascular: Normal rate, regular rhythm and intact distal  pulses.  No murmur heard. Pulmonary/Chest: Effort normal. He has decreased breath sounds. He has no wheezes. He has rhonchi in the right upper field, the right middle field, the right lower field, the left upper field, the left middle field and the left lower field. He has no rales.  Abdominal: Soft. Bowel sounds are normal.  Musculoskeletal: Normal range of motion.  Neurological: He is alert and oriented to person, place, and time.  Skin: Skin is warm and dry.     Labs and Imaging: CBC BMET  Recent Labs  Lab 04/07/18 1547  WBC 11.2*  HGB 10.9*  HCT 34.7*  PLT 280   Recent Labs  Lab 04/07/18 1547  NA 129*  K 4.1  CL 98  CO2 23  BUN 11  CREATININE 0.85  GLUCOSE 97  CALCIUM 8.4*     6/29 Blood cultures x2 pending 6/29 BNP 105.5 6/29 Troponin I negative x1 6/29 INR 1.21 6/29 aPPT 37 6/29 HIV non-reactive 6/29 I-STAT lactic acid 0.85  CT CHEST WITH CONTRAST (04/07/2018) 1.  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 2. Nonspecific 1 cm lucent lesion in the T3 vertebral body. Consider non-emergent bone scan or thoracic spine MRI with contrast for further evaluation. 3.  Emphysema (ICD10-J43.9).  PORTABLE CHEST 1 VIEW (04/07/2018) 1. New infiltrate in the lateral right lung base. 2. Cystic changes in the upper lobes correlate with the reported history of previous tuberculosis. 3. Increasing density in the left upper lobe could be positional or technical. A developing infiltrate is not excluded. A PA and lateral chest x-ray could better evaluate.  Kathrene Alu, MD 04/07/2018, 7:10 PM PGY-1, [place] Intern pager: [phone], text pages welcome  Upper Level Addendum: I have seen and evaluated this patient along with Dr. Shan Levans and reviewed the above note, making necessary revisions in blue.  Harriet Butte, [place], PGY-2

## 2018-04-07 NOTE — ED Notes (Signed)
Report given.

## 2018-04-07 NOTE — ED Triage Notes (Signed)
Pt presents to ED for assessment of persistent nosebleeds x 3 days, spontaneous, lasting 5 minutes at a time.  Pt speaks [place] and [place].  Notes from congregational nurse states patient to follow up on latent TB medications.  Patient verbalizes no understanding of that at this time.  Patient has wet cough in triage, states red bloody sputum.

## 2018-04-08 DIAGNOSIS — A1801 Tuberculosis of spine: Secondary | ICD-10-CM

## 2018-04-08 DIAGNOSIS — R634 Abnormal weight loss: Secondary | ICD-10-CM

## 2018-04-08 DIAGNOSIS — R627 Adult failure to thrive: Secondary | ICD-10-CM

## 2018-04-08 DIAGNOSIS — A159 Respiratory tuberculosis unspecified: Secondary | ICD-10-CM

## 2018-04-08 LAB — CBC
HCT: 32 % — ABNORMAL LOW (ref 39.0–52.0)
Hemoglobin: 10.1 g/dL — ABNORMAL LOW (ref 13.0–17.0)
MCH: 29.2 pg (ref 26.0–34.0)
MCHC: 31.6 g/dL (ref 30.0–36.0)
MCV: 92.5 fL (ref 78.0–100.0)
PLATELETS: 265 10*3/uL (ref 150–400)
RBC: 3.46 MIL/uL — ABNORMAL LOW (ref 4.22–5.81)
RDW: 13.4 % (ref 11.5–15.5)
WBC: 14.9 10*3/uL — AB (ref 4.0–10.5)

## 2018-04-08 LAB — COMPREHENSIVE METABOLIC PANEL
ALK PHOS: 54 U/L (ref 38–126)
ALT: 9 U/L (ref 0–44)
ANION GAP: 5 (ref 5–15)
AST: 16 U/L (ref 15–41)
Albumin: 2.2 g/dL — ABNORMAL LOW (ref 3.5–5.0)
BILIRUBIN TOTAL: 0.4 mg/dL (ref 0.3–1.2)
BUN: 13 mg/dL (ref 8–23)
CALCIUM: 8.2 mg/dL — AB (ref 8.9–10.3)
CO2: 25 mmol/L (ref 22–32)
Chloride: 105 mmol/L (ref 98–111)
Creatinine, Ser: 0.71 mg/dL (ref 0.61–1.24)
GFR calc Af Amer: >60 mL/min (ref >60)
GLUCOSE: 127 mg/dL — AB (ref 70–99)
Potassium: 4 mmol/L (ref 3.5–5.1)
Sodium: 135 mmol/L (ref 135–145)
TOTAL PROTEIN: 7 g/dL (ref 6.5–8.1)

## 2018-04-08 MED ORDER — NICOTINE 7 MG/24HR TD PT24
7.0000 mg | MEDICATED_PATCH | Freq: Every day | TRANSDERMAL | Status: DC
  Administered 2018-04-08 – 2018-04-13 (×6): 7 mg via TRANSDERMAL
  Filled 2018-04-08 (×6): qty 1

## 2018-04-08 NOTE — Progress Notes (Signed)
Family Medicine Teaching Service Daily Progress Note Intern Pager: [phone]  Patient name: Todd Duncan Medical record number: [id number] Date of birth: 1946/04/10 Age: 72 y.o. Gender: male  Primary Care Provider: System, Pcp Not In Consultants: Infectious disease Code Status: Full  Pt Overview and Major Events to Date:  Todd Duncan is a 72 y.o. male presenting with R sided chest wall pain and cough productive of bloody sputum. PMH is significant for asthma and tobacco use.  Assessment and Plan:  Hemoptysis  possible active tuberculosis: History of fevers, chills, weight loss, cough productive of frank blood, and previous positive quantiferon gold TB test without treatment support diagnosis of active TB. CXR was significant for new infiltrate in the lateral RLL, cystic changes in upper lobes, and increasing density in LUL. CT chest with multilobar PNA in LUL and RLL with chronic changes in upper lobes consistent w/ chronic TB, 1 cm lucent lesion in T3 vertebral body, and emphysema.  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  Vitals are wnl again this am (6/30). S/p ceftriaxone and azithromycin for presumed CAP in the ED. - airborne precautions - ID consulted, appreciate recommendations - f/u blood cultures - AFB smear of sputum - consider continuing CTX and azithromycin, may need to change therapy - tylenol 650 mg Q6H, ibuprofen 200 mg Q6H alternating for costochondritis (not PRN d/t language barrier)  Hyponatremia: Resolved. Sodium of 129 on admission, corrected this am to 135. Likely low due to extra-sensory loses due to active infection. - Cont NS @ 100 ml/hr  Emphysema: patient told he has asthma, but he likely has emphysema given changes seen on CXR and CT and tobacco use history. - may benefit from Spiriva on discharge  Tobacco use: patient smokes about 4 cigarettes per day.  Used to smoke more, has a 50 year smoking history. - Nicotine patch daily  FEN/GI: reg  diet PPx: lovenox  Disposition: home  Subjective:  Patient reports on chest pain this am. He states he is breathing well and has no complaints this morning. His food tray was empty, patient has good appetite and is not having difficulty breathing.  Objective: Temp:  [97.4 F (36.3 C)-98.4 F (36.9 C)] 97.4 F (36.3 C) (06/30 0416) Pulse Rate:  [51-87] 70 (06/30 0416) Resp:  [16-18] 18 (06/30 0416) BP: (94-116)/(59-75) 97/72 (06/30 0416) SpO2:  [94 %-100 %] 100 % (06/30 0416) Physical Exam: General: Alert & Oriented; Ill-appearing, no distress Cardiovascular: RRR, no murmurs or gallops, normal S1, S2 split Respiratory: diffuse rhonchi bilaterally in all lung fields, poor air movement, no wheezing Abdomen: soft, non-tender, non-distended, +bs Extremities: no cyanosis or edema Skin: warm and dry  Laboratory: Recent Labs  Lab 04/07/18 1547 04/08/18 0841  WBC 11.2* 14.9*  HGB 10.9* 10.1*  HCT 34.7* 32.0*  PLT 280 265   Recent Labs  Lab 04/07/18 1547 04/08/18 0841  NA 129* 135  K 4.1 4.0  CL 98 105  CO2 23 25  BUN 11 13  CREATININE 0.85 0.71  CALCIUM 8.4* 8.2*  PROT 7.9 7.0  BILITOT 0.9 0.4  ALKPHOS 56 54  ALT 11 9  AST 18 16  GLUCOSE 97 127*   6/29 Lactic Acid 0.85 6/29 Blood cultures x2 pending 6/29 BNP 105.5 6/29 Troponin I negative x1 6/29 INR 1.21 6/29 aPPT 37 6/29 HIV non-reactive 6/29 AFB smear - needs collecting 6/29 Acid Fast Culture w/sensitivities - needs collecting  Imaging/Diagnostic Tests: CT CHEST WITH CONTRAST (04/07/2018) 1. Multilobar pneumonia involving the left  upper and right lower lobes superimposed on chronic changes in the bilateral upper lobes related to chronic mycobacterial infection. 2. Nonspecific 1 cm lucent lesion in the T3 vertebral body. Consider non-emergent bone scan or thoracic spine MRI with contrast for further evaluation. 3. Emphysema (ICD10-J43.9).  PORTABLE CHEST 1 VIEW (04/07/2018) 1. New infiltrate in the  lateral right lung base. 2. Cystic changes in the upper lobes correlate with the reported history of previous tuberculosis. 3. Increasing density in the left upper lobe could be positional or technical. A developing infiltrate is not excluded. A PA and lateral chest x-ray could better evaluate.  Nuala Alpha, DO 04/08/2018, 7:39 AM PGY-1, [place] Intern pager: [phone], text pages welcome

## 2018-04-08 NOTE — Consult Note (Signed)
Date of Admission:  04/07/2018          Reason for Consult: Pulmonary tuberculosis   Referring Provider: Dr. Erin Hearing   Assessment:  1. 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 Likely missed diagnosis of asthma 1 year previously in [place]:  1. Obtain sputum every 8 hours to be sent for AFB stain and culture 2. If above sputum are unrevealing consider consultation with pulmonary critical care to obtain bronchoscopy for BAL and AFB cultures 3. Would get MRI of spine 4. Would consider IR guided aspirate of spine so that sample could be sent for AFB cultures, bacterial cultures  5. 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 The [place] department needs to be notified on Monday 7. The health department will have to do testing with his immediate family members 55. I will also get in touch with Dr. Baxter Flattery from an infection prevention standpoint since this patient has interacted with healthcare system since January visit to [facility] including an  ER visit in March prior to his current admission 9. Ensure HIV test done  Active Problems:   Tuberculosis   Scheduled Meds: . acetaminophen  650 mg Oral Q6H  . enoxaparin (LOVENOX) injection  40 mg Subcutaneous Q24H  . ibuprofen  200 mg Oral QID  . nicotine  7 mg Transdermal Daily   Continuous Infusions: . sodium chloride 100 mL/hr at 04/08/18 1121   PRN Meds:.  HPI: Todd Duncan is a 72 y.o. male man from [place] who speaks largely Sw[place]ili with only little bit of [place].  He tells me that one year ago while he was living in [place] he developed a chronic cough.  He tells me that they had worked him up for tuberculosis but could find no evidence of tuberculosis while he was in [place] and instead diagnosed him with asthma.  I find this letter diagnosis very curious in a man who is 72 years of age and never had such problems before.  Apparently since his moved to  the [place] 6 months ago he has been seen in late fall early winter by the [facility] community wellness after he been found to have a positive QuantiFERON blood test and a chest x-ray that was suspicious for pulmonary tuberculosis bilateral infiltrates in his upper lobes seen on October 17, 2017.  He was referred to the regional center for infectious disease and seen by our nurse petitioner [name].  [name] instructed the patient to not go back to work and to wear a mask at all times when in public the plan was to obtain 3 sputum for AFB cultures and to initiate for drug treatment for TB.  Since that time however none of the above things seem to have occurred.  I do not know what happened or why the patient did not follow-up.  But he received no interceding treatment.  In fact he was seen in the emergency department on 27 March for a cough and diagnosed with mild intermittent asthma.  Unfortunately it does not appear that anyone had looked back in the chart to see that he was being worked up for active pulmonary tuberculosis and that he had a chest x-ray that was read by radiology as being "highly suspicious for pulmonary tuberculosis.  He has remained untreated in the intervening months now right through into late June and nearly July.  Now came  back to the emergency department with right-sided chest wall pain and a cough productive of bloody sputum.  He endorses cough as mentioned going back a year which is worsened recently with production of frank blood.  He has lost about 7 to 10 kg he states.  He has had fevers chills and drenching night sweats.  On admission here he had a CT scan of the lungs performed which showed   1. 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 2. Nonspecific 1 cm lucent lesion in the T3 vertebral  body.  Been placed in airborne precautions and AFB sputum cultures have been ordered.  HIV test is negative.  We need to get sputum sent off for AFB stain and culture and if these are not revealing consider bronchoscopy for BAL to be sent for culture.  I will also get an MRI of his spine.  We could consider IR guided sampling of the spine to look for AFB cultures and bacterial cultures.   Dr. Megan Salon will take over the service tomorrow.   Review of Systems: Review of Systems  Constitutional: Positive for chills, diaphoresis, fever, malaise/fatigue and weight loss.  HENT: Negative for congestion, hearing loss, sore throat and tinnitus.   Eyes: Negative for blurred vision and double vision.  Respiratory: Positive for cough, hemoptysis, sputum production and shortness of breath. Negative for wheezing.   Cardiovascular: Positive for chest pain and palpitations. Negative for leg swelling.  Gastrointestinal: Negative for abdominal pain, blood in stool, constipation, diarrhea, heartburn, melena, nausea and vomiting.  Genitourinary: Negative for dysuria, flank pain and hematuria.  Musculoskeletal: Negative for back pain, falls, joint pain and myalgias.  Skin: Negative for itching and rash.  Neurological: Negative for dizziness, sensory change, focal weakness, loss of consciousness, weakness and headaches.  Endo/Heme/Allergies: Does not bruise/bleed easily.  Psychiatric/Behavioral: Negative for depression, memory loss and suicidal ideas. The patient is not nervous/anxious.     Past Medical History:  Diagnosis Date  . Asthma     Social History   Tobacco Use  . Smoking status: Former Research scientist (life sciences)  . Smokeless tobacco: Never Used  Substance Use Topics  . Alcohol use: Yes    Frequency: Never    Comment: Occasional  . Drug use: No    Family History  Family history unknown: Yes   No Known Allergies  OBJECTIVE: Blood pressure 103/64, pulse 74, temperature 97.7 F (36.5 C),  temperature source Oral, resp. rate 16, SpO2 100 %.  Physical Exam  Constitutional: He is oriented to person, place, and time. He is cooperative. He appears ill. No distress.  HENT:  Head: Normocephalic and atraumatic.  Right Ear: Hearing and external ear normal.  Left Ear: Hearing and external ear normal.  Nose: No rhinorrhea or nasal deformity. No epistaxis.  Eyes: Pupils are equal, round, and reactive to light. Conjunctivae and EOM are normal. Right conjunctiva is not injected. Left conjunctiva is not injected. No scleral icterus.  Neck: Normal range of motion. Neck supple. No JVD present.  Cardiovascular: Normal rate, regular rhythm, S1 normal, S2 normal and normal heart sounds. Exam reveals no friction rub.  No murmur heard. Pulmonary/Chest: Effort normal. No respiratory distress. He has rales.  Abdominal: Soft. Normal appearance and bowel sounds are normal. He exhibits no distension and no ascites. There is no hepatosplenomegaly. There is no tenderness.  Musculoskeletal: Normal range of motion.       Right  shoulder: Normal.       Left shoulder: Normal.       Right hip: Normal.       Left hip: Normal.       Right knee: Normal.       Left knee: Normal.  Lymphadenopathy:       Head (right side): No submandibular, no preauricular and no posterior auricular adenopathy present.       Head (left side): No submandibular, no preauricular and no posterior auricular adenopathy present.    He has no cervical adenopathy.       Right cervical: No superficial cervical and no deep cervical adenopathy present.      Left cervical: No superficial cervical and no deep cervical adenopathy present.  Neurological: He is alert and oriented to person, place, and time. He has normal strength. No sensory deficit. Coordination and gait normal.  Skin: Skin is warm, dry and intact. No abrasion, no bruising, no ecchymosis, no lesion and no rash noted. He is not diaphoretic. No cyanosis or erythema. No pallor.  Nails show no clubbing.  Psychiatric: He has a normal mood and affect. His speech is normal and behavior is normal. Judgment and thought content normal. Cognition and memory are normal. He is attentive.    Lab Results Lab Results  Component Value Date   WBC 14.9 (H) 04/08/2018   HGB 10.1 (L) 04/08/2018   HCT 32.0 (L) 04/08/2018   MCV 92.5 04/08/2018   PLT 265 04/08/2018    Lab Results  Component Value Date   CREATININE 0.71 04/08/2018   BUN 13 04/08/2018   NA 135 04/08/2018   K 4.0 04/08/2018   CL 105 04/08/2018   CO2 25 04/08/2018    Lab Results  Component Value Date   ALT 9 04/08/2018   AST 16 04/08/2018   ALKPHOS 54 04/08/2018   BILITOT 0.4 04/08/2018     Microbiology: No results found for this or any previous visit (from the past 240 hour(s)).  Alcide Evener, [facility] for [facility] [phone] pager  04/08/2018, 2:31 PM

## 2018-04-09 ENCOUNTER — Encounter (HOSPITAL_COMMUNITY): Payer: Self-pay

## 2018-04-09 LAB — BASIC METABOLIC PANEL
Anion gap: 7 (ref 5–15)
BUN: 11 mg/dL (ref 8–23)
CO2: 23 mmol/L (ref 22–32)
CREATININE: 0.64 mg/dL (ref 0.61–1.24)
Calcium: 7.9 mg/dL — ABNORMAL LOW (ref 8.9–10.3)
Chloride: 110 mmol/L (ref 98–111)
GFR calc Af Amer: >60 mL/min (ref >60)
GFR calc non Af Amer: >60 mL/min (ref >60)
GLUCOSE: 88 mg/dL (ref 70–99)
POTASSIUM: 3.8 mmol/L (ref 3.5–5.1)
SODIUM: 140 mmol/L (ref 135–145)

## 2018-04-09 LAB — CBC
HEMATOCRIT: 28.4 % — AB (ref 39.0–52.0)
Hemoglobin: 9.1 g/dL — ABNORMAL LOW (ref 13.0–17.0)
MCH: 29.8 pg (ref 26.0–34.0)
MCHC: 32 g/dL (ref 30.0–36.0)
MCV: 93.1 fL (ref 78.0–100.0)
PLATELETS: 250 10*3/uL (ref 150–400)
RBC: 3.05 MIL/uL — ABNORMAL LOW (ref 4.22–5.81)
RDW: 13.5 % (ref 11.5–15.5)
WBC: 10 10*3/uL (ref 4.0–10.5)

## 2018-04-09 LAB — HIV ANTIBODY (ROUTINE TESTING W REFLEX): HIV Screen 4th Generation wRfx: NONREACTIVE

## 2018-04-09 MED ORDER — POLYETHYLENE GLYCOL 3350 17 G PO PACK
17.0000 g | PACK | Freq: Every day | ORAL | Status: DC | PRN
  Administered 2018-04-09: 17 g via ORAL
  Filled 2018-04-09: qty 1

## 2018-04-09 MED ORDER — POLYETHYLENE GLYCOL 3350 17 G PO PACK: 17.0000 g | PACK | Freq: Every day | ORAL | Status: DC

## 2018-04-09 NOTE — Progress Notes (Signed)
Patient ID: Todd Duncan, male   DOB: 11-24-1945, 72 y.o.   MRN: [id number]          [facility] for Infectious Disease    Date of Admission:  04/07/2018     [name] was evaluated by the health department earlier this year.  His QuantiFERON gold TB assay was positive.  He had multiple sputum samples collected and his AFB smears and cultures were negative.  He had some upper lobe scarring on chest x-ray.  He was diagnosed with latent tuberculosis and started on INH and rifapentine on 01/11/2018.  I spoke with Therisa Doyne [phone]), [facility] TB nurse this morning.  She believes he has been adherent.  He was admitted here over the weekend with hemoptysis and CT scan shows:  IMPRESSION: 1. 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 2. Nonspecific 1 cm lucent lesion in the T3 vertebral body. Consider non-emergent bone scan or thoracic spine MRI with contrast for further evaluation. 3.  Emphysema (ICD10-J43.9).    By: Titus Dubin M.D.   On: 04/07/2018 18:11         The appearance is very typical of active pulmonary tuberculosis.  One sputum sample has been sent to the lab and AFB smear is pending.  A small lucency in T3 is not particularly suggestive of skeletal tuberculosis.  MRI is pending.  It is best to observe off of TB therapy for now.  I will follow closely with you.  Michel Bickers, MD [facility] for [facility] [phone] pager   [phone] cell 04/09/2018, 11:56 AM

## 2018-04-09 NOTE — Progress Notes (Addendum)
Family Medicine Teaching Service Daily Progress Note Intern Pager: [phone]  Patient name: Todd Duncan Medical record number: [id number] Date of birth: 06-08-1946 Age: 72 y.o. Gender: male  Primary Care Provider: System, Pcp Not In Consultants: Infectious disease Code Status: Full  Pt Overview and Major Events to Date:  Todd Duncan is a 72 y.o. male presenting with R sided chest wall pain and cough productive of bloody sputum. PMH is significant for asthma and tobacco use. High suspicion for TB.  Assessment and Plan:  Hemoptysis  possible active tuberculosis: History of fevers, chills, weight loss, cough productive of Todd Duncan blood, and previous positive quantiferon gold TB test without treatment support diagnosis of active TB. CXR was significant for new infiltrate in the lateral RLL, cystic changes in upper lobes, and increasing density in LUL. CT chest with multilobar PNA in LUL and RLL with chronic changes in upper lobes consistent w/ chronic TB, 1 cm lucent lesion in T3 vertebral body, and emphysema.  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  Vitals are wnl again this am (6/30). S/p ceftriaxone and azithromycin for presumed CAP in the ED. - airborne precautions - ID consulted, appreciate recommendations - f/u blood cultures - AFB smear of sputum x3 - will consider BAL if AFB cultures are unremarkalbe - tylenol 650 mg Q6H, ibuprofen 200 mg Q6H alternating for costochondritis (not PRN d/t language barrier)  Vertebral lesion (T3) 1 cm lesion of the T3 vertebral body MRI of thoracic spine to evaluate for Pott's disease   Hyponatremia: Resolved. Sodium of 129 on admission, corrected this am to 135. Likely low due to extra-sensory loses due to active infection. - Cont NS @ 100 ml/hr  Emphysema: patient told he has asthma, but he likely has emphysema given changes seen on CXR and CT and tobacco use history. - may benefit from Spiriva on discharge  Tobacco use: patient  smokes about 4 cigarettes per day.  Used to smoke more, has a 50 year smoking history. - Nicotine patch daily  FEN/GI: reg diet PPx: lovenox  Disposition: FPTS   Subjective:  Spoke w/ pt through an interpreter.  Pt does not have any new complaints today.  Objective: Temp:  [97.4 F (36.3 C)-97.6 F (36.4 C)] 97.5 F (36.4 C) (07/01 1537) Pulse Rate:  [66-76] 71 (07/01 1537) Resp:  [16-17] 16 (07/01 1537) BP: (96-101)/(63-82) 96/63 (07/01 1537) SpO2:  [100 %] 100 % (07/01 1537) Physical Exam: General: Pt sitting up in bed well appearing, eating breakfast comfortably Cardiac: RRR, 3/6 systolic murmur heard best at Left sternal border, no rubs or gallups Respiratory: normal respiratory effort, no wheezing/crackles Abdomen: soft, nontender Extremities: strong peripheral pulses, warm, moist  Laboratory: Recent Labs  Lab 04/07/18 1547 04/08/18 0841 04/09/18 0715  WBC 11.2* 14.9* 10.0  HGB 10.9* 10.1* 9.1*  HCT 34.7* 32.0* 28.4*  PLT 280 265 250   Recent Labs  Lab 04/07/18 1547 04/08/18 0841 04/09/18 0715  NA 129* 135 140  K 4.1 4.0 3.8  CL 98 105 110  CO2 _0 BUN _1 CREATININE 0.85 0.71 0.64  CALCIUM 8.4* 8.2* 7.9*  PROT 7.9 7.0  --   BILITOT 0.9 0.4  --   ALKPHOS 56 54  --   ALT 11 9  --   AST 18 16  --   GLUCOSE 97 127* 88   6/29 Lactic Acid 0.85 6/29 Blood cultures x2 pending 6/29 BNP 105.5 6/29 Troponin I negative x1 6/29 INR 1.21  6/29 aPPT 37 6/29 HIV non-reactive 6/29 AFB smear - needs collecting 6/29 Acid Fast Culture w/sensitivities - needs collecting  Imaging/Diagnostic Tests: CT CHEST WITH CONTRAST (04/07/2018) 1. 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 2. Nonspecific 1 cm lucent lesion in the T3 vertebral body. Consider non-emergent bone scan or thoracic spine MRI with contrast for further  evaluation. 3. Emphysema (ICD10-J43.9).  PORTABLE CHEST 1 VIEW (04/07/2018) 1. New infiltrate in the lateral right lung base. 2. Cystic changes in the upper lobes correlate with the reported history of previous tuberculosis. 3. Increasing density in the left upper lobe could be positional or technical. A developing infiltrate is not excluded. A PA and lateral chest x-ray could better evaluate.  Todd Haymaker, MD 04/09/2018, 4:24 PM PGY-1, [place] Intern pager: [phone], text pages welcome

## 2018-04-09 NOTE — Care Management Note (Addendum)
Case Management Note  Patient Details  Name: Taylor Levick MRN: [id number] Date of Birth: 19-Dec-1945  Subjective/Objective:                    Action/Plan:  Left voicemail for [facility] nurse awaiting call back. Spoke to [employer] [phone]), at [facility] Department . She will follow for plan of care. NCM to give updates   Expected Discharge Date:                  Expected Discharge Plan:  Home/Self Care  In-House Referral:     Discharge planning Services  CM Consult  Post Acute Care Choice:    Choice offered to:     DME Arranged:    DME Agency:     HH Arranged:    HH Agency:     Status of Service:  In process, will continue to follow  If discussed at Long Length of Stay Meetings, dates discussed:    Additional Comments:  Marilu Favre, RN 04/09/2018, 11:50 AM

## 2018-04-10 ENCOUNTER — Inpatient Hospital Stay (HOSPITAL_COMMUNITY): Payer: Medicaid Other

## 2018-04-10 DIAGNOSIS — J45909 Unspecified asthma, uncomplicated: Secondary | ICD-10-CM | POA: Diagnosis present

## 2018-04-10 DIAGNOSIS — J441 Chronic obstructive pulmonary disease with (acute) exacerbation: Secondary | ICD-10-CM | POA: Diagnosis present

## 2018-04-10 LAB — CBC
HEMATOCRIT: 29.5 % — AB (ref 39.0–52.0)
HEMOGLOBIN: 9.1 g/dL — AB (ref 13.0–17.0)
MCH: 28.7 pg (ref 26.0–34.0)
MCHC: 30.8 g/dL (ref 30.0–36.0)
MCV: 93.1 fL (ref 78.0–100.0)
Platelets: 307 10*3/uL (ref 150–400)
RBC: 3.17 MIL/uL — AB (ref 4.22–5.81)
RDW: 13.3 % (ref 11.5–15.5)
WBC: 6.3 10*3/uL (ref 4.0–10.5)

## 2018-04-10 LAB — BASIC METABOLIC PANEL
ANION GAP: 3 — AB (ref 5–15)
BUN: 8 mg/dL (ref 8–23)
CHLORIDE: 110 mmol/L (ref 98–111)
CO2: 23 mmol/L (ref 22–32)
Calcium: 7.5 mg/dL — ABNORMAL LOW (ref 8.9–10.3)
Creatinine, Ser: 0.66 mg/dL (ref 0.61–1.24)
GFR calc non Af Amer: >60 mL/min (ref >60)
Glucose, Bld: 89 mg/dL (ref 70–99)
Potassium: 3.7 mmol/L (ref 3.5–5.1)
Sodium: 136 mmol/L (ref 135–145)

## 2018-04-10 LAB — ACID FAST SMEAR (AFB, MYCOBACTERIA)
Acid Fast Smear: NEGATIVE
Acid Fast Smear: NEGATIVE

## 2018-04-10 LAB — ACID FAST SMEAR (AFB)

## 2018-04-10 MED ORDER — GADOBENATE DIMEGLUMINE 529 MG/ML IV SOLN
10.0000 mL | Freq: Once | INTRAVENOUS | Status: AC
  Administered 2018-04-10: 10 mL via INTRAVENOUS

## 2018-04-10 NOTE — Progress Notes (Signed)
Family Medicine Teaching Service Daily Progress Note Intern Pager: [phone]  Patient name: Todd Duncan Medical record number: [id number] Date of birth: August 12, 1946 Age: 72 y.o. Gender: male  Primary Care Provider: System, Pcp Not In Consultants: Infectious disease Code Status: Full  Pt Overview and Major Events to Date:  Todd Duncan is a 72 y.o. male presenting with R sided chest wall pain and cough productive of bloody sputum. PMH is significant for asthma and tobacco use. High suspicion for TB.  Assessment and Plan:  Hemoptysis  possible active tuberculosis: History of fevers, chills, weight loss, cough productive of [name] blood, and previous positive quantiferon gold TB test without treatment support diagnosis of active TB. CXR was significant for new infiltrate in the lateral RLL, cystic changes in upper lobes, and increasing density in LUL. CT chest with multilobar PNA in LUL and RLL with chronic changes in upper lobes consistent w/ chronic TB, 1 cm lucent lesion in T3 vertebral body, and emphysema. Vitals are wnl again this am (6/30). S/p ceftriaxone and azithromycin for presumed CAP in the ED. - airborne precautions - ID consulted, appreciate recommendations - f/u blood cultures - AFB smear of sputum x3 - will consider BAL if AFB cultures are unremarkalbe - tylenol 650 mg Q6H, ibuprofen 200 mg Q6H alternating for costochondritis (not PRN d/t language barrier)  Vertebral lesion (T3) 1 cm lesion of the T3 vertebral body MRI of thoracic spine to evaluate for Pott's disease   Hyponatremia: Resolved. Sodium of 129 on admission, corrected this am to 135. Likely low due to extra-sensory loses due to active infection. - Cont NS @ 100 ml/hr  Emphysema: patient told he has asthma, but he likely has emphysema given changes seen on CXR and CT and tobacco use history. - may benefit from Spiriva on discharge  Tobacco use: patient smokes about 4 cigarettes per day.  Used to smoke more,  has a 50 year smoking history. - Nicotine patch daily  FEN/GI: reg diet PPx: lovenox  Disposition: FPTS   Subjective:  Spoke w/ pt through an interpreter. Much more success with Swahili than Kinyarwanda. Pt reports that he continues to have bloody sputum although it is decreasing.  No new complaints. Pt noted that he would like to take a bath as it has been several days and he is unsure how to bathe with the IV in place.  Objective: Temp:  [97.5 F (36.4 C)-97.8 F (36.6 C)] 97.6 F (36.4 C) (07/02 0621) Pulse Rate:  [63-71] 63 (07/02 0621) Resp:  [16-18] 18 (07/02 0621) BP: (96-128)/(63-79) 115/79 (07/02 0621) SpO2:  [98 %-100 %] 98 % (07/02 2836) Physical Exam: General: Alert and cooperative and appears to be in no acute distress HEENT: Neck non-tender without lymphadenopathy, masses or thyromegaly Cardio: Normal A1 and S2, no S3 or S4. Rhythm is regular. 3/6 systolic murmur at left sternal border. no rubs.   Pulm: Clear to auscultation bilaterally, no crackles, wheezing, or diminished breath sounds. Normal respiratory effort Abdomen: Bowel sounds normal. Abdomen soft and non-tender.  Extremities: No peripheral edema. Warm/ well perfused.  Strong radial and pedal pulses. Neuro: Cranial nerves grossly intact    Laboratory: Recent Labs  Lab 04/08/18 0841 04/09/18 0715 04/10/18 0448  WBC 14.9* 10.0 6.3  HGB 10.1* 9.1* 9.1*  HCT 32.0* 28.4* 29.5*  PLT 265 250 307   Recent Labs  Lab 04/07/18 1547 04/08/18 0841 04/09/18 0715 04/10/18 0448  NA 129* 135 140 136  K 4.1 4.0 3.8 3.7  CL 98  105 110 110  CO2 _0 BUN _1 CREATININE 0.85 0.71 0.64 0.66  CALCIUM 8.4* 8.2* 7.9* 7.5*  PROT 7.9 7.0  --   --   BILITOT 0.9 0.4  --   --   ALKPHOS 56 54  --   --   ALT 11 9  --   --   AST 18 16  --   --   GLUCOSE 97 127* 88 89   6/29 Lactic Acid 0.85 6/29 Blood cultures x2 pending 6/29 BNP 105.5 6/29 Troponin I negative x1 6/29 INR 1.21 6/29 aPPT 37 6/29  HIV non-reactive 6/29 AFB smear - needs collecting 6/29 Acid Fast Culture w/sensitivities - needs collecting  Imaging/Diagnostic Tests: CT CHEST WITH CONTRAST (04/07/2018) 1. 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 2. Nonspecific 1 cm lucent lesion in the T3 vertebral body. Consider non-emergent bone scan or thoracic spine MRI with contrast for further evaluation. 3. Emphysema (ICD10-J43.9).  PORTABLE CHEST 1 VIEW (04/07/2018) 1. New infiltrate in the lateral right lung base. 2. Cystic changes in the upper lobes correlate with the reported history of previous tuberculosis. 3. Increasing density in the left upper lobe could be positional or technical. A developing infiltrate is not excluded. A PA and lateral chest x-ray could better evaluate.  Matilde Haymaker, MD 04/10/2018, 7:56 AM PGY-1, [place] Intern pager: [phone], text pages welcome

## 2018-04-10 NOTE — Plan of Care (Signed)
  Problem: Pain Managment: Goal: General experience of comfort will improve Outcome: Progressing   

## 2018-04-10 NOTE — Plan of Care (Signed)
  Problem: Clinical Measurements: Goal: Ability to maintain clinical measurements within normal limits will improve Outcome: Progressing Goal: Diagnostic test results will improve Outcome: Progressing   

## 2018-04-10 NOTE — Progress Notes (Signed)
[place] for Infectious Disease  Date of Admission:  04/07/2018   Total days of antibiotics 1         ASSESSMENT: Mr. Weatherholtz is a 72 yo male who was admitted with hemoptysis and cough for three weeks. His CT scan showed concerns for multilobar pneumonia involving the upper and right lower lobes superimposed on chronic changes in the bilateral upper lobes related to chronic mycobacterial infection. Patient's acid fast smear from 04/07/18 is negative. This could be due to patient being on partial therapy of INH and rifapentine sine 01/11/18. Still waiting on cultures but even if AFB cultures are negative active TB cannot be ruled out. Patient is continuing to have episodes of hemoptysis but chest pain and cough have improved. The appearance is very typical of active pulmonary tuberculosis but would recommend getting pulmonology on board to obtain a bronchoscopy for BAL for possible pneumonia infiltrate or even aspergillosis or aspergilloma   PLAN: 1. Obtain sputum every 8 hours to be sent for AFB stain and culture 2. Consider consultation with pulmonology to obtain bronchoscopy for BAL and AFB cultures 3. If specimen culture confirms active TB would discuss therapy options with Dr. Brigitte Pulse because of possible MDR-TB 4. If hemoptysis continues with no positive cultures or stains, still consider isolation and treatment outpatient 5. Consider MRI for thoracic spine    Principal Problem:   Hemoptysis Active Problems:   COPD exacerbation (HCC)   Asthma   Positive QuantiFERON-TB Gold test   Normocytic anemia   Scheduled Meds: . acetaminophen  650 mg Oral Q6H  . enoxaparin (LOVENOX) injection  40 mg Subcutaneous Q24H  . ibuprofen  200 mg Oral QID  . nicotine  7 mg Transdermal Daily   Continuous Infusions: . sodium chloride 100 mL/hr at 04/10/18 0619   PRN Meds:.polyethylene glycol   SUBJECTIVE: Mr. Hoogland is a 72 yo male originally from [place], who moved here from  [place] 7 months ago, who presented to the ED on 04/07/18 with epistaxis and a cough for three weeks. Patient was also experiencing right sided chest pain, fever, chills, weight loss and SOB. He found blood in his sputum and was experiencing episodic nosebleeds from his right nare for 3 days prior to his admission. He was given ceftriaxone and azithromycin in the ED for presumed CAP.   He was evaluated by the health department earlier this year and found to have a positive QuantiFERON gold TB assay was positive. His sputum samples and AFB smears and cultures were negative at that time. He had some upper lobe scarring on chest x-ray. He was diagnosed with latent tuberculosis and started on INH and rifapentine on 01/11/2018.   Today, Mr. Robicheaux was seen and evaluated bedside. Patient reported his cough has improved but he is still having episodes of hemoptysis. He denies chest pain, feeling febrile or having chills during the night. He stated he was diagnosed with asthma since he was born and has had a chronic cough since the age of 8. He denied any recent traveling besides moving here from [place] 7 months ago. He also denied any sick contacts.   Review of Systems: Review of Systems  Constitutional: Negative for chills and fever.  Respiratory: Positive for cough, hemoptysis and shortness of breath.   Cardiovascular: Negative for chest pain and leg swelling.  Gastrointestinal: Negative for abdominal pain.  Psychiatric/Behavioral: The patient is not nervous/anxious.     No Known Allergies  OBJECTIVE: Vitals:  04/09/18 0542 04/09/18 1537 04/09/18 2245 04/10/18 0621  BP: 98/67 96/63 128/74 115/79  Pulse: 66 71 71 63  Resp: _0 Temp: 97.6 F (36.4 C) (!) 97.5 F (36.4 C) 97.8 F (36.6 C) 97.6 F (36.4 C)  TempSrc: Oral Oral Oral Oral  SpO2:  100% 99% 98%   There is no height or weight on file to calculate BMI.  Physical Exam  Constitutional: He is oriented to person, place, and time.   Cardiovascular: Normal rate and regular rhythm. Exam reveals no gallop.  No murmur heard. Pulmonary/Chest: Effort normal. No tachypnea. No respiratory distress. He has rhonchi in the right middle field.  Neurological: He is alert and oriented to person, place, and time.  Psychiatric: He has a normal mood and affect. His behavior is normal.    Lab Results Lab Results  Component Value Date   WBC 6.3 04/10/2018   HGB 9.1 (L) 04/10/2018   HCT 29.5 (L) 04/10/2018   MCV 93.1 04/10/2018   PLT 307 04/10/2018    Lab Results  Component Value Date   CREATININE 0.66 04/10/2018   BUN 8 04/10/2018   NA 136 04/10/2018   K 3.7 04/10/2018   CL 110 04/10/2018   CO2 23 04/10/2018    Lab Results  Component Value Date   ALT 9 04/08/2018   AST 16 04/08/2018   ALKPHOS 54 04/08/2018   BILITOT 0.4 04/08/2018     Microbiology: Recent Results (from the past 240 hour(s))  Culture, blood (Routine X 2) w Reflex to ID Panel     Status: None (Preliminary result)   Collection Time: 04/07/18  3:45 PM  Result Value Ref Range Status   Specimen Description BLOOD RIGHT FOREARM  Final   Special Requests   Final    BOTTLES DRAWN AEROBIC AND ANAEROBIC Blood Culture adequate volume   Culture   Final    NO GROWTH 2 DAYS Performed at [facility], [address] [place], [place] [address]    Report Status PENDING  Incomplete  Culture, blood (Routine X 2) w Reflex to ID Panel     Status: None (Preliminary result)   Collection Time: 04/07/18  3:50 PM  Result Value Ref Range Status   Specimen Description BLOOD LEFT ANTECUBITAL  Final   Special Requests   Final    BOTTLES DRAWN AEROBIC AND ANAEROBIC Blood Culture results may not be optimal due to an inadequate volume of blood received in culture bottles   Culture   Final    NO GROWTH 2 DAYS Performed at [facility], [place] [address], [place], [place] [address]    Report Status PENDING  Incomplete  Acid Fast Smear (AFB)     Status: None    Collection Time: 04/07/18 10:42 PM  Result Value Ref Range Status   AFB Specimen Processing Concentration  Final   Acid Fast Smear Negative  Final    Comment: (NOTE) Performed At: [facility] [place], [place] [id number] Rush Farmer MD [id number]    Source (AFB) SPUTUM  Final    [place], [place] [facility]  04/10/2018, 11:46 AM

## 2018-04-11 ENCOUNTER — Encounter (HOSPITAL_COMMUNITY): Payer: Self-pay | Admitting: General Practice

## 2018-04-11 ENCOUNTER — Other Ambulatory Visit: Payer: Self-pay

## 2018-04-11 DIAGNOSIS — Z720 Tobacco use: Secondary | ICD-10-CM

## 2018-04-11 DIAGNOSIS — R7612 Nonspecific reaction to cell mediated immunity measurement of gamma interferon antigen response without active tuberculosis: Secondary | ICD-10-CM

## 2018-04-11 DIAGNOSIS — D649 Anemia, unspecified: Secondary | ICD-10-CM

## 2018-04-11 DIAGNOSIS — J189 Pneumonia, unspecified organism: Secondary | ICD-10-CM

## 2018-04-11 DIAGNOSIS — R042 Hemoptysis: Secondary | ICD-10-CM

## 2018-04-11 LAB — ACID FAST SMEAR (AFB)

## 2018-04-11 LAB — BASIC METABOLIC PANEL
Anion gap: 4 — ABNORMAL LOW (ref 5–15)
BUN: 10 mg/dL (ref 8–23)
CALCIUM: 8.1 mg/dL — AB (ref 8.9–10.3)
CO2: 26 mmol/L (ref 22–32)
CREATININE: 0.72 mg/dL (ref 0.61–1.24)
Chloride: 109 mmol/L (ref 98–111)
GFR calc non Af Amer: >60 mL/min (ref >60)
Glucose, Bld: 89 mg/dL (ref 70–99)
Potassium: 3.7 mmol/L (ref 3.5–5.1)
SODIUM: 139 mmol/L (ref 135–145)

## 2018-04-11 LAB — CBC
HCT: 31.4 % — ABNORMAL LOW (ref 39.0–52.0)
HEMOGLOBIN: 9.8 g/dL — AB (ref 13.0–17.0)
MCH: 28.8 pg (ref 26.0–34.0)
MCHC: 31.2 g/dL (ref 30.0–36.0)
MCV: 92.4 fL (ref 78.0–100.0)
PLATELETS: 387 10*3/uL (ref 150–400)
RBC: 3.4 MIL/uL — ABNORMAL LOW (ref 4.22–5.81)
RDW: 13.3 % (ref 11.5–15.5)
WBC: 5.6 10*3/uL (ref 4.0–10.5)

## 2018-04-11 LAB — ACID FAST SMEAR (AFB, MYCOBACTERIA): Acid Fast Smear: NEGATIVE

## 2018-04-11 MED ORDER — ENSURE ENLIVE PO LIQD
237.0000 mL | Freq: Two times a day (BID) | ORAL | Status: DC
  Administered 2018-04-11 – 2018-04-12 (×2): 237 mL via ORAL

## 2018-04-11 NOTE — Progress Notes (Signed)
[place] for Infectious Disease  Date of Admission:  04/07/2018   Total days of antibiotics 1        Azithromycin Day 1        Ceftriaxone Day 1  ASSESSMENT: Todd Duncan is a 72 year old male from [place], recently moved here from [place], who presented with hemoptysis and cough. His CT scan showed concerns for multilobar pneumonia involving the upper and right lower lobes superimposed on chronic changes in the bilateral upper lobes related to chronic mycobacterial infection. His new diagnostic findings include 3 negative sputum AFB smears. This could be due to patient being on partial therapy of INH and rifapentine sine 01/11/18. Still waiting on cultures but even if AFB cultures and smears are negative active TB cannot be ruled out.   He is continuing to have episodes of hemoptysis but chest pain and cough have improved. It is still not clear if this is a case of hemoptysis vs. Epitaxsis. He reported an episode of fever and drenching night sweats the night he was admitted. He also confirmed that he took the [place] and rifapentine therapy for about 5 weeks. It seems his symptoms arose shortly after discontinuing therapy. The appearance is very typical of active pulmonary tuberculosis but would recommend getting pulmonology on board to obtain a bronchoscopy for BAL for possible pneumonia infiltrate or even aspergillosis or aspergilloma    PLAN: 1. Consult pulmonology to obtain bronchoscopy for BAL and AFB cultures 2. If specimen cultures confirm active TB would discuss therapy options with Dr. Brigitte Pulse, state TB director, and our local county TB nurse. 3. If hemoptysis continues with no diagnostic findings, consider isolation and treatment outpatient  Principal Problem:   Hemoptysis Active Problems:   COPD exacerbation (HCC)   Asthma   Positive QuantiFERON-TB Gold test   Normocytic anemia   Scheduled Meds: . acetaminophen  650 mg Oral Q6H  . enoxaparin (LOVENOX) injection   40 mg Subcutaneous Q24H  . ibuprofen  200 mg Oral QID  . nicotine  7 mg Transdermal Daily   Continuous Infusions: . sodium chloride 100 mL/hr at 04/11/18 0842   PRN Meds:.polyethylene glycol   SUBJECTIVE: Todd Duncan is a 72 yo male originally from [place], who moved here from [place] 7 months ago, who presented to the ED on 04/07/18 with epistaxis and a cough for three weeks. Patient was also experiencing right sided chest pain, fever, chills, weight loss and SOB. He found blood in his sputum and was experiencing episodic nosebleeds from his right nare for 3 days prior to his admission. He was given ceftriaxone and azithromycin in the ED for presumed CAP.   He was evaluated by the health department earlier this year and found to have a positive QuantiFERON gold TB assay was positive. His sputum samples and AFB smears and cultures were negative at that time. He had some upper lobe scarring on chest x-ray. He was diagnosed with latent tuberculosis and started on INH and rifapentine on 01/11/2018.   Patient reported his cough has improved but he is still having episodes of hemoptysis. He denies chest pain, feeling febrile or having chills during the night. He stated he was diagnosed with asthma since he was born and has had a chronic cough since the age of 66. He denied any recent traveling besides moving here from [place] 7 months ago. He also denied any sick contacts.     Review of Systems: Review of Systems  Constitutional: Positive for weight loss. Negative for chills and fever.  Respiratory: Positive for cough and hemoptysis.   Cardiovascular: Negative for chest pain.    No Known Allergies  OBJECTIVE: Vitals:   04/10/18 0621 04/10/18 1445 04/10/18 2210 04/11/18 0507  BP: 115/79 (!) 111/93 111/79 117/82  Pulse: 63 73 71 65  Resp: _0 Temp: 97.6 F (36.4 C) (!) 97.5 F (36.4 C) 98 F (36.7 C) (!) 97.5 F (36.4 C)  TempSrc: Oral Oral Oral Oral  SpO2: 98% 100% 98% 100%    There is no height or weight on file to calculate BMI.  Physical Exam  Cardiovascular: Normal rate and regular rhythm. Exam reveals no gallop.  No murmur heard. Pulmonary/Chest: Effort normal. He has wheezes in the right upper field, the right middle field, the right lower field, the left upper field, the left middle field and the left lower field.    Lab Results Lab Results  Component Value Date   WBC 5.6 04/11/2018   HGB 9.8 (L) 04/11/2018   HCT 31.4 (L) 04/11/2018   MCV 92.4 04/11/2018   PLT 387 04/11/2018    Lab Results  Component Value Date   CREATININE 0.72 04/11/2018   BUN 10 04/11/2018   NA 139 04/11/2018   K 3.7 04/11/2018   CL 109 04/11/2018   CO2 26 04/11/2018    Lab Results  Component Value Date   ALT 9 04/08/2018   AST 16 04/08/2018   ALKPHOS 54 04/08/2018   BILITOT 0.4 04/08/2018     Microbiology: Recent Results (from the past 240 hour(s))  Culture, blood (Routine X 2) w Reflex to ID Panel     Status: None (Preliminary result)   Collection Time: 04/07/18  3:45 PM  Result Value Ref Range Status   Specimen Description BLOOD RIGHT FOREARM  Final   Special Requests   Final    BOTTLES DRAWN AEROBIC AND ANAEROBIC Blood Culture adequate volume   Culture   Final    NO GROWTH 3 DAYS Performed at [facility], [address] [place], [place] [address]    Report Status PENDING  Incomplete  Culture, blood (Routine X 2) w Reflex to ID Panel     Status: None (Preliminary result)   Collection Time: 04/07/18  3:50 PM  Result Value Ref Range Status   Specimen Description BLOOD LEFT ANTECUBITAL  Final   Special Requests   Final    BOTTLES DRAWN AEROBIC AND ANAEROBIC Blood Culture results may not be optimal due to an inadequate volume of blood received in culture bottles   Culture   Final    NO GROWTH 3 DAYS Performed at [facility], [place] [address] [place], [place] [address]    Report Status PENDING  Incomplete  Acid Fast Smear (AFB)     Status:  None   Collection Time: 04/07/18 10:42 PM  Result Value Ref Range Status   AFB Specimen Processing Concentration  Final   Acid Fast Smear Negative  Final    Comment: (NOTE) Performed At: [facility] [place], [place] [id number] Rush Farmer MD PY:[id number]    Source (AFB) SPUTUM  Final  Acid Fast Smear (AFB)     Status: None   Collection Time: 04/09/18 12:58 PM  Result Value Ref Range Status   AFB Specimen Processing Concentration  Final   Acid Fast Smear Negative  Final    Comment: (NOTE) Performed At: [address] [address]  [place], [place] [id number] Rush Farmer MD [id number]    Source (AFB) SPUTUM  Final    Comment: Performed at [facility], [place] [address] [place], [place] [address]  Acid Fast Smear (AFB)     Status: None   Collection Time: 04/10/18 12:41 AM  Result Value Ref Range Status   AFB Specimen Processing Concentration  Final   Acid Fast Smear Negative  Final    Comment: (NOTE) Performed At: [facility] [address] [place], [place] [id number] Rush Farmer MD [id number]    Source (AFB) EXPECTORATED SPUTUM  Final    [place], [place] [facility] Infectious [facility]  04/11/2018, 10:04 AM

## 2018-04-11 NOTE — Progress Notes (Signed)
Family Medicine Teaching Service Daily Progress Note Intern Pager: [phone]  Patient name: Todd Duncan Medical record number: [id number] Date of birth: December 27, 1945 Age: 72 y.o. Gender: male  Primary Care Provider: System, Pcp Not In Consultants: Infectious disease Code Status: Full  Pt Overview and Major Events to Date:  Todd Duncan is a 72 y.o. male presenting with R sided chest wall pain and cough productive of bloody sputum. PMH is significant for asthma and tobacco use. High suspicion for TB.  Assessment and Plan:  Hemoptysis  possible active tuberculosis: History of fevers, chills, weight loss, cough productive of Todd Duncan blood, and previous positive quantiferon gold TB test without treatment support diagnosis of active TB. CXR was significant for new infiltrate in the lateral RLL, cystic changes in upper lobes, and increasing density in LUL.  - CT chest with multilobar PNA in LUL and RLL with chronic changes in upper lobes consistent w/ chronic TB, 1 cm lucent lesion in T3 vertebral body, and emphysema. S/p ceftriaxone and azithromycin for presumed CAP in the ED. - airborne precautions - f/u blood cultures - Positive quantiferon gold January 2019 - AFB smear of sputum x3 - three negatives - will consider BAL - tylenol 650 mg Q6H, ibuprofen 200 mg Q6H alternating for costochondritis (not PRN d/t language barrier) - will continue to communicate with ID regarding best course for abx and other infectious w/u (e.g. Aspergillosis) - consult Pulm regarding further diagnostic/treatment options  Vertebral Hemangioma (T3) 1 cm lesion of the T3 vertebral body MRI of thoracic spine to evaluate for Pott's disease  - MRI revealed vertebral hemangioma in T3, not concerning for infectious etiology  Hyponatremia: Resolved. Sodium of 129 on admission, corrected this am to 135. Likely low due to extra-sensory loses due to active infection. - Cont NS @ 100 ml/hr  Emphysema: patient told he has  asthma, but he likely has emphysema given changes seen on CXR and CT and tobacco use history. - may benefit from Spiriva on discharge  Tobacco use: patient smokes about 4 cigarettes per day.  Used to smoke more, has a 50 year smoking history. - Nicotine patch daily  FEN/GI: reg diet PPx: lovenox  Disposition: FPTS   Subjective:  Spoke with pt this am through audio translator (Swahili). Pt has no new complaints this am and denies any fevers/chills.  Objective: Temp:  [97.5 F (36.4 C)-98 F (36.7 C)] 97.5 F (36.4 C) (07/03 0507) Pulse Rate:  [65-73] 65 (07/03 0507) Resp:  [16-18] 17 (07/03 0507) BP: (111-117)/(79-93) 117/82 (07/03 0507) SpO2:  [98 %-100 %] 100 % (07/03 0507) Physical Exam: General: Alert and cooperative and appears to be in no acute distress HEENT: Neck non-tender without lymphadenopathy, masses or thyromegaly Cardio: Normal A1 and S2, no S3 or S4. Rhythm is regular. 3/6 systolic murmur at left sternal border. no rubs.   Pulm: Clear to auscultation bilaterally, no crackles, wheezing, or diminished breath sounds. Normal respiratory effort Abdomen: Bowel sounds normal. Abdomen soft and non-tender.  Extremities: No peripheral edema. Warm/ well perfused.  Strong radial and pedal pulses. Neuro: Cranial nerves grossly intact      Laboratory: Recent Labs  Lab 04/08/18 0841 04/09/18 0715 04/10/18 0448  WBC 14.9* 10.0 6.3  HGB 10.1* 9.1* 9.1*  HCT 32.0* 28.4* 29.5*  PLT 265 250 307   Recent Labs  Lab 04/07/18 1547 04/08/18 0841 04/09/18 0715 04/10/18 0448  NA 129* 135 140 136  K 4.1 4.0 3.8 3.7  CL 98 105 110 110  CO2  _0 BUN _1 CREATININE 0.85 0.71 0.64 0.66  CALCIUM 8.4* 8.2* 7.9* 7.5*  PROT 7.9 7.0  --   --   BILITOT 0.9 0.4  --   --   ALKPHOS 56 54  --   --   ALT 11 9  --   --   AST 18 16  --   --   GLUCOSE 97 127* 88 89   6/29 Lactic Acid 0.85 6/29 Blood cultures x2 pending 6/29 BNP 105.5 6/29 Troponin I negative  x1 6/29 INR 1.21 6/29 aPPT 37 6/29 HIV non-reactive 6/29 AFB smear - negative 6/29 Acid Fast Culture w/sensitivities - in process 7/1  AFB smear - negative 7/2  AFB smear - negative  Imaging/Diagnostic Tests: CT CHEST WITH CONTRAST (04/07/2018) 1. 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 2. Nonspecific 1 cm lucent lesion in the T3 vertebral body. Consider non-emergent bone scan or thoracic spine MRI with contrast for further evaluation. 3. Emphysema (ICD10-J43.9).  PORTABLE CHEST 1 VIEW (04/07/2018) 1. New infiltrate in the lateral right lung base. 2. Cystic changes in the upper lobes correlate with the reported history of previous tuberculosis. 3. Increasing density in the left upper lobe could be positional or technical. A developing infiltrate is not excluded. A PA and lateral chest x-ray could better evaluate.  Mr Thoracic Spine W Wo Contrast  Result Date: 04/10/2018 CLINICAL DATA:  T3 lucent lesion seen on CT. EXAM: MRI THORACIC WITHOUT AND WITH CONTRAST TECHNIQUE: Multiplanar and multiecho pulse sequences of the thoracic spine were obtained without and with intravenous contrast. CONTRAST:  65m MULTIHANCE GADOBENATE DIMEGLUMINE 529 MG/ML IV SOLN COMPARISON:  CT chest dated April 07, 2018. FINDINGS: MRI THORACIC SPINE FINDINGS Alignment:  Physiologic. Vertebrae: No fracture, evidence of discitis, or suspicious bone lesion. The 1 cm lucent lesion within the T3 vertebral body seen on CT corresponds to a T1 and T2 hyperintense, nonenhancing lesion, most consistent with a hemangioma. Additional small hemangioma in the T2 vertebral body. Cord:  Normal signal and morphology.  No intradural enhancement. Paraspinal and other soft tissues: Small bilateral pleural effusions. Chronic changes in the bilateral upper lobes with left upper and right lower lobe consolidation,  better evaluated on recent CT. Disc levels: No significant disc bulge, spinal canal, or neuroforaminal stenosis at any level. IMPRESSION: 1. The 1 cm lucent lesion within the T3 vertebral body seen on CT corresponds to a small hemangioma. Additional small hemangioma in the T2 vertebral body. No suspicious osseous lesion. 2. No evidence of osteomyelitis-discitis. Electronically Signed   By: WTitus DubinM.D.   On: 04/10/2018 15:05     FMatilde Haymaker MD 04/11/2018, 6:47 AM PGY-1, CPuyallupIntern pager: [phone] text pages welcome

## 2018-04-11 NOTE — Consult Note (Signed)
Name: Todd Duncan MRN: [id number] DOB: 1946-05-09    ADMISSION DATE:  04/07/2018 CONSULTATION DATE:  04/11/18  REFERRING MD :  Dr. Erin Hearing / FPTS   CHIEF COMPLAINT:  Cough, Hemoptysis & Chest Wall Pain   HISTORY OF PRESENT ILLNESS:  72 y/o [place] male (speaks Swahili with some English) who presented to [facility] on 6/29 with reports of chest pain, cough and hemoptysis.  While in [place], he was reportedly diagnosed with "asthma" one year ago.  Interpreter utilized for the interview.    The patient reports he came to the hospital for coughing up blood.  He states this has been happening for June 27th.  He reports he was coughing up approximately a cup of blood per day.  It was worst when it first started and dark and on 7/3 he states it has slowed.  He has not had much production (7/3).   He states he has a cough but the blood is something new.  He reports he had a fever and cold chills when symptoms started.  He has never experienced this before.  He states he has lost approximately 5 lbs.  He is a former smoker - he began smoking at age 80, smoked "heavy" before he fled but after fleeing he could not afford the cigarettes and would only smoke 2 per day.  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   He has been in the [place] since January 2019.  He came with his wife and children.  He was seen at the [facility] Department and found to be quantiferon gold positive.  CXR 10/2017 was suspicious for pulmonary tuberculosis with bilateral upper lobe infiltrates.  He was referred to the [facility] in [place] and was instructed to wear a mask in public, obtain 3 sputum for culture & initiate drug therapy.  He has been followed at the health department. He was again seen 01/03/18 in the ER for cough and diagnosed with mild persistent asthma. He was ultimately diagnosed with latent tuberculosis and treated with INH and rifapentine 01/11/18 (health department RN  confirms that he has been adherent).    PCCM consulted for evaluation of hemoptysis.   PAST MEDICAL HISTORY :   has a past medical history of Asthma.   has no past surgical history on file.  Prior to Admission medications   Medication Sig Start Date End Date Taking? Authorizing Provider  isoniazid (NYDRAZID) 300 MG tablet Take 900 mg by mouth every 7 (seven) days.   Yes [provider]  pyridOXINE (VITAMIN B-6) 50 MG tablet Take 50 mg by mouth every 7 (seven) days.   Yes [provider]  Rifapentine (PRIFTIN) 150 MG TABS Take 900 mg by mouth every 7 (seven) days.   Yes [provider]    No Known Allergies  FAMILY HISTORY:  Family history is unknown by patient.  SOCIAL HISTORY:  reports that he has quit smoking. He has never used smokeless tobacco. He reports that he drinks alcohol. He reports that he does not use drugs.  REVIEW OF SYSTEMS:  POSITIVES IN BOLD Constitutional: Negative for fevers, chills, weight loss of 5lbs, malaise/fatigue and diaphoresis.  HENT: Negative for hearing loss, ear pain, nosebleeds, congestion, sore throat, neck pain, tinnitus and ear discharge.   Eyes: Negative for blurred vision, double vision, photophobia, pain, discharge and redness.  Respiratory: Negative for cough, hemoptysis, pleuritic chest pain, sputum production, shortness of breath, wheezing and stridor.   Cardiovascular: Negative  for chest pain, palpitations, orthopnea, claudication, leg swelling and PND.  Gastrointestinal: Negative for heartburn, nausea, vomiting, abdominal pain, diarrhea, constipation, blood in stool and melena.  Genitourinary: Negative for dysuria, urgency, frequency, hematuria and flank pain.  Musculoskeletal: Negative for myalgias, back pain, joint pain and falls.  Skin: Negative for itching and rash.  Neurological: Negative for dizziness, tingling, tremors, sensory change, speech change, focal weakness, seizures, loss of consciousness,  weakness and headaches.  Endo/Heme/Allergies: Negative for environmental allergies and polydipsia. Does not bruise/bleed easily.   SUBJECTIVE:   VITAL SIGNS: Temp:  [97.5 F (36.4 C)-98 F (36.7 C)] 97.5 F (36.4 C) (07/03 0507) Pulse Rate:  [65-73] 65 (07/03 0507) Resp:  [16-18] 17 (07/03 0507) BP: (111-117)/(79-93) 117/82 (07/03 0507) SpO2:  [98 %-100 %] 100 % (07/03 0507)  PHYSICAL EXAMINATION: General:  Thin adult male in NAD, sitting in bed Neuro:  AAOx4, speech clear, MAE, good dentition  HEENT:  MM pink/moist, no jvd Cardiovascular:  s1s2 rrr, no m/r/g  Lungs:  Diminished bilaterally, normal effort  Abdomen:  Soft, non-tender, bsx4 active  Musculoskeletal:  No acute deformities, no clubbing  Skin:  Warm/dry, no edema  Recent Labs  Lab 04/09/18 0715 04/10/18 0448 04/11/18 0621  NA 140 136 139  K 3.8 3.7 3.7  CL 110 110 109  CO2 23 23 26   BUN 11 8 10   CREATININE 0.64 0.66 0.72  GLUCOSE 88 89 89    Recent Labs  Lab 04/09/18 0715 04/10/18 0448 04/11/18 0621  HGB 9.1* 9.1* 9.8*  HCT 28.4* 29.5* 31.4*  WBC 10.0 6.3 5.6  PLT 250 307 387    Mr Thoracic Spine W Wo Contrast  Result Date: 04/10/2018 CLINICAL DATA:  T3 lucent lesion seen on CT. EXAM: MRI THORACIC WITHOUT AND WITH CONTRAST TECHNIQUE: Multiplanar and multiecho pulse sequences of the thoracic spine were obtained without and with intravenous contrast. CONTRAST:  105mL MULTIHANCE GADOBENATE DIMEGLUMINE 529 MG/ML IV SOLN COMPARISON:  CT chest dated April 07, 2018. FINDINGS: MRI THORACIC SPINE FINDINGS Alignment:  Physiologic. Vertebrae: No fracture, evidence of discitis, or suspicious bone lesion. The 1 cm lucent lesion within the T3 vertebral body seen on CT corresponds to a T1 and T2 hyperintense, nonenhancing lesion, most consistent with a hemangioma. Additional small hemangioma in the T2 vertebral body. Cord:  Normal signal and morphology.  No intradural enhancement. Paraspinal and other soft tissues: Small  bilateral pleural effusions. Chronic changes in the bilateral upper lobes with left upper and right lower lobe consolidation, better evaluated on recent CT. Disc levels: No significant disc bulge, spinal canal, or neuroforaminal stenosis at any level. IMPRESSION: 1. The 1 cm lucent lesion within the T3 vertebral body seen on CT corresponds to a small hemangioma. Additional small hemangioma in the T2 vertebral body. No suspicious osseous lesion. 2. No evidence of osteomyelitis-discitis. Electronically Signed   By: Titus Dubin M.D.   On: 04/10/2018 15:05     SIGNIFICANT EVENTS  6/29  Admit with hemoptysis  7/03  PCCM consulted   STUDIES CT Chest 6/29 >> multilobar pneumonia involving the left upper, right lower lobes superimposed on chronic changes in the bilateral upper lobes related to chronic mycobacterial infection.  Nonspecific 1cm lucent lesion in the T3 vertebral body, emphysema.    CULTURES HIV 6/29 >> negative  BCx2 6/29 >>  AFB 6/29 >> negative smear AFB 7/1 >> negative smear AFB 7/2 >> negative smear  AFB 7/3 >>   ANTIBIOTICS     ASSESSMENT / PLAN:  Discussion:  72 y/o M, former smoker, admitted with cough, hemoptysis and concern for reactivation TB. Previously on medications for latent TB via health department.   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

## 2018-04-11 NOTE — Progress Notes (Signed)
MEDICATION RELATED NOTE  Pharmacy Re:  Home Meds  Medical History: Past Medical History:  Diagnosis Date  . Asthma     Medications:  Medications Prior to Admission  Medication Sig Dispense Refill Last Dose  . isoniazid (NYDRAZID) 300 MG tablet Take 900 mg by mouth every 7 (seven) days.     . pyridOXINE (VITAMIN B-6) 50 MG tablet Take 50 mg by mouth every 7 (seven) days.     . Rifapentine (PRIFTIN) 150 MG TABS Take 900 mg by mouth every 7 (seven) days.       Assessment: Pharmacy has attempted multiple times to complete a medication history for this patient but have been unsuccessful except by identifying medications that has been filled and on record for him.  We tried using the interpreter services but have not been successful.  Current medications on his admission list are only the meds that we have confirmed filled for him.  Plan:  I have marked his record complete.  Please let us know if we can update this should new information become available.  Rober Minion, PharmD., MS Clinical Pharmacist Pager:  [phone] Thank you for allowing pharmacy to be part of this patients care team. 04/11/2018,8:25 AM

## 2018-04-12 DIAGNOSIS — E440 Moderate protein-calorie malnutrition: Secondary | ICD-10-CM

## 2018-04-12 LAB — CBC
HEMATOCRIT: 30.7 % — AB (ref 39.0–52.0)
HEMOGLOBIN: 9.7 g/dL — AB (ref 13.0–17.0)
MCH: 29 pg (ref 26.0–34.0)
MCHC: 31.6 g/dL (ref 30.0–36.0)
MCV: 91.6 fL (ref 78.0–100.0)
PLATELETS: 394 10*3/uL (ref 150–400)
RBC: 3.35 MIL/uL — AB (ref 4.22–5.81)
RDW: 13.2 % (ref 11.5–15.5)
WBC: 7.5 10*3/uL (ref 4.0–10.5)

## 2018-04-12 LAB — BASIC METABOLIC PANEL
Anion gap: 5 (ref 5–15)
BUN: 10 mg/dL (ref 8–23)
CO2: 27 mmol/L (ref 22–32)
CREATININE: 0.63 mg/dL (ref 0.61–1.24)
Calcium: 8.6 mg/dL — ABNORMAL LOW (ref 8.9–10.3)
Chloride: 107 mmol/L (ref 98–111)
GFR calc Af Amer: >60 mL/min (ref >60)
GLUCOSE: 89 mg/dL (ref 70–99)
Potassium: 3.6 mmol/L (ref 3.5–5.1)
Sodium: 139 mmol/L (ref 135–145)

## 2018-04-12 LAB — ACID FAST SMEAR (AFB, MYCOBACTERIA)

## 2018-04-12 LAB — CULTURE, BLOOD (ROUTINE X 2)
CULTURE: NO GROWTH
Culture: NO GROWTH
Special Requests: ADEQUATE

## 2018-04-12 LAB — ACID FAST SMEAR (AFB): ACID FAST SMEAR - AFSCU2: NEGATIVE

## 2018-04-12 MED ORDER — ENSURE ENLIVE PO LIQD
237.0000 mL | Freq: Three times a day (TID) | ORAL | Status: DC
  Administered 2018-04-12 (×2): 237 mL via ORAL

## 2018-04-12 NOTE — Plan of Care (Signed)
  Problem: Activity: Goal: Risk for activity intolerance will decrease Outcome: Progressing   Problem: Nutrition: Goal: Adequate nutrition will be maintained Outcome: Progressing   Problem: Coping: Goal: Level of anxiety will decrease Outcome: Progressing   Problem: Elimination: Goal: Will not experience complications related to bowel motility Outcome: Progressing Goal: Will not experience complications related to urinary retention Outcome: Progressing   Problem: Pain Managment: Goal: General experience of comfort will improve Outcome: Progressing   Problem: Safety: Goal: Ability to remain free from injury will improve Outcome: Progressing   Problem: Skin Integrity: Goal: Risk for impaired skin integrity will decrease Outcome: Progressing   Problem: Spiritual Needs Goal: Ability to function at adequate level Outcome: Progressing

## 2018-04-12 NOTE — Progress Notes (Signed)
   04/12/18 1100  Clinical Encounter Type  Visited With Patient  Visit Type Initial  Referral From Nurse  Consult/Referral To Chaplain  Spiritual Encounters  Spiritual Needs Prayer;Emotional  Stress Factors  Patient Stress Factors Exhausted    Pt was standing in his room looking through the window. No family was present but pt talked about family struggles as a newly arrived [place] immigrant. Chaplain had a very good conversation with pt who was very Patent attorney. Chaplain provided compassionate presence and prayer.  Sherri Levenhagen a Medical sales representative, [employer]

## 2018-04-12 NOTE — Progress Notes (Addendum)
FMTS Attending Daily Note:  Todd Netters MD Personal pager:  [phone] FPTS Service Pager:  [phone]  I have seen and examined this patient and have reviewed their chart. I have discussed this patient with the resident. I agree with the resident's findings, assessment and care plan.  Additionally:  Patient without complaints today Appreciate ID, pulmonology assistance with patient   Todd Rio, MD   --------------------------------------------  [facility] Medicine Teaching Service Daily Progress Note Intern Pager: [phone]  Patient name: Todd Duncan Medical record number: [id number] Date of birth: Jun 09, 1946 Age: 72 y.o. Gender: male  Primary Care Provider: System, Pcp Not In Consultants: Infectious disease Code Status: Full  Pt Overview and Major Events to Date:  Todd Duncan is a 72 y.o. male presenting with R sided chest wall pain and cough productive of bloody sputum. PMH is significant for asthma and tobacco use. High suspicion for TB.  Assessment and Plan:  Hemoptysis  possible active tuberculosis: History of fevers, chills, weight loss, cough productive of frank blood, and previous positive quantiferon gold TB test without treatment support diagnosis of active TB. CXR was significant for new infiltrate in the lateral RLL, cystic changes in upper lobes, and increasing density in LUL.  - CT chest with multilobar PNA in LUL and RLL with chronic changes in upper lobes consistent w/ chronic TB, 1 cm lucent lesion in T3 vertebral body, and emphysema. S/p ceftriaxone and azithromycin for presumed CAP in the ED. - airborne precautions - f/u blood cultures - Positive quantiferon gold January 2019 - previously treated for latent TB with ING and rifapentine (01/11/2018) reported to be compliant by health department RN - AFB smear of sputum x3 - three negatives - tylenol 650 mg Q6H, ibuprofen 200 mg Q6H alternating for costochondritis (not PRN d/t language barrier) - will  continue to communicate with ID regarding best course for abx and other infectious w/u (e.g. Aspergillosis) - Pulm following, appreciate recs - BAL planned for 7/5 - Will not begin RIPE therapy unless positive TB confirmed, will Bronch on 7/5 and evaluate for other infectious causes (I.e. Aspergilloma, other fungal, cancer)  Vertebral Hemangioma (T3) 1 cm lesion of the T3 vertebral body MRI of thoracic spine to evaluate for Pott's disease  - MRI revealed vertebral hemangioma in T3, not concerning for infectious etiology  Hyponatremia: Resolved. Sodium of 129 on admission, corrected this am to 135. Likely low due to extra-sensory loses due to active infection. - Cont NS @ 100 ml/hr  Emphysema: patient told he has asthma, but he likely has emphysema given changes seen on CXR and CT and tobacco use history. - may benefit from Spiriva on discharge  Tobacco use: patient smokes about 4 cigarettes per day.  Used to smoke more, has a 50 year smoking history. - Nicotine patch daily  FEN/GI: reg diet PPx: lovenox  Disposition: FPTS   Subjective:  Spoke with pt this am through audio translator (Swahili). No new complaints this am.  Spoke with pt about future bronch on 7/5 and answered questions about procedure. pt would like to talk to family about the procedure.  Objective: Temp:  [97.3 F (36.3 C)-98.1 F (36.7 C)] 97.3 F (36.3 C) (07/04 0422) Pulse Rate:  [58-65] 63 (07/04 0422) Resp:  [16-19] 16 (07/04 0422) BP: (112-130)/(75-86) 130/86 (07/04 0422) SpO2:  [99 %-100 %] 100 % (07/04 0422) Physical Exam: General: Alert and cooperative and appears to be in no acute distress, sleeping in bed when I arrived and sat up and was  engaged during our conversation. HEENT: Neck non-tender without lymphadenopathy, masses or thyromegaly Cardio: Normal A1 and S2, no S3 or S4. Rhythm is regular. 5-8/5 systolic murmur at left sternal border. no rubs.  Pulm: Clear to auscultation bilaterally, no  crackles, wheezing, or diminished breath sounds. Normal respiratory effort Abdomen: Bowel sounds normal. Abdomen soft and non-tender.  Extremities: No peripheral edema. Warm/ well perfused.  Strong radial and pedal pulses. Neuro: Cranial nerves grossly intact    Laboratory: Recent Labs  Lab 04/10/18 0448 04/11/18 0621 04/12/18 0512  WBC 6.3 5.6 7.5  HGB 9.1* 9.8* 9.7*  HCT 29.5* 31.4* 30.7*  PLT 307 387 394   Recent Labs  Lab 04/07/18 1547 04/08/18 0841 04/09/18 0715 04/10/18 0448 04/11/18 0621  NA 129* 135 140 136 139  K 4.1 4.0 3.8 3.7 3.7  CL 98 105 110 110 109  CO2 _0 BUN _1 CREATININE 0.85 0.71 0.64 0.66 0.72  CALCIUM 8.4* 8.2* 7.9* 7.5* 8.1*  PROT 7.9 7.0  --   --   --   BILITOT 0.9 0.4  --   --   --   ALKPHOS 56 54  --   --   --   ALT 11 9  --   --   --   AST 18 16  --   --   --   GLUCOSE 97 127* 88 89 89   6/29 Lactic Acid 0.85 6/29 Blood cultures x2 pending 6/29 BNP 105.5 6/29 Troponin I negative x1 6/29 INR 1.21 6/29 aPPT 37 6/29 HIV non-reactive 6/29 AFB smear - negative 6/29 Acid Fast Culture w/sensitivities - in process 7/1  AFB smear - negative 7/2  AFB smear - negative  Imaging/Diagnostic Tests: CT CHEST WITH CONTRAST (04/07/2018) 1. 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 2. Nonspecific 1 cm lucent lesion in the T3 vertebral body. Consider non-emergent bone scan or thoracic spine MRI with contrast for further evaluation. 3. Emphysema (ICD10-J43.9).  PORTABLE CHEST 1 VIEW (04/07/2018) 1. New infiltrate in the lateral right lung base. 2. Cystic changes in the upper lobes correlate with the reported history of previous tuberculosis. 3. Increasing density in the left upper lobe could be positional or technical. A developing infiltrate is not excluded. A PA and lateral chest x-ray could  better evaluate.  No results found.   Todd Haymaker, MD 04/12/2018, 6:25 AM PGY-1, [place] Intern pager: [phone], text pages welcome

## 2018-04-12 NOTE — Progress Notes (Signed)
[facility] for Infectious Disease  Date of Admission:  04/07/2018   Total days of antibiotics 1        Azithromycin Day 1        Ceftriaxone Day 1         ASSESSMENT: Todd Duncan is a 72 year old male from [place], recently moved here from [place], who was admitted due to hemoptysis and cough. His CT scan showed concerns for multilobar pneumonia involving the upper and right lower lobes superimposed on chronic changes in the bilateral upper lobes related to chronic mycobacterial infection.His new diagnostic findings include 3 negative sputum AFB smears. This could be due to patient being on partial therapy of INH and rifapentine sine 01/11/18. Still waiting on culturesbut even if AFB cultures and smears are negative active TB cannot be ruled out.   He is continuing to have episodes of blood in his sputum but his chest pain and cough have improved. It is still not clear if this is a case of hemoptysis vs. Epitaxsis. He reported an episode of fever and drenching night sweats the night he was admitted. He also confirmed that he took the [place] and rifapentine therapy for about 5 weeks. It seems his symptoms arose shortly after discontinuing therapy.   We are in agreement with Dr. Brigitte Pulse, the [facility] TB director's recommendations of getting a bronchoscopy for more specimens and avoiding empiric treatment for now. The appearance is very typical of active pulmonary tuberculosis but still cannot rule out nontuberculous mycobacterial infection, nocardia, or malignancy. We will follow with pulmonology and the bronchoscopy results.   PLAN: 1. Follow with pulmonology and cultures from bronchoscopy  2. If specimen cultures confirm active TB would discuss therapy options with Dr. Brigitte Pulse and local TB nurse 3. If hemoptysis continues with no diagnostic findings, consider isolation and treatment outpatient  Principal Problem:   Hemoptysis Active Problems:   COPD exacerbation  (HCC)   Asthma   Positive QuantiFERON-TB Gold test   Normocytic anemia   Multifocal pneumonia   Tobacco abuse   Scheduled Meds: . acetaminophen  650 mg Oral Q6H  . enoxaparin (LOVENOX) injection  40 mg Subcutaneous Q24H  . feeding supplement (ENSURE ENLIVE)  237 mL Oral BID BM  . ibuprofen  200 mg Oral QID  . nicotine  7 mg Transdermal Daily   Continuous Infusions: . sodium chloride 100 mL/hr at 04/12/18 0625   PRN Meds:.polyethylene glycol   SUBJECTIVE: Todd Duncan is a 72 yo male originally from [place], who moved here from [place] 7 months ago, who presented to the ED on 04/07/18 with epistaxis and a cough for three weeks. Patient was also experiencing right sided chest pain, fever, chills, weight loss and SOB. He found blood in his sputum and was experiencing episodic nosebleeds from his right nare for 3 days prior to his admission. He was given ceftriaxone and azithromycin in the ED for presumed CAP.   He was evaluated by the health department earlier this year and found to have a positive QuantiFERON gold TB assay was positive. His sputum samples and AFB smears and cultures were negative at that time. He had some upper lobe scarring on chest x-ray. He was diagnosed with latent tuberculosis and started on INH and rifapentine on 01/11/2018.   He stated he was diagnosed with asthma since he was born and has had a chronic cough since the age of 73. He denied any recent traveling besides  moving here from [place] 7 months ago. He also denied any sick contacts.  Today, Todd Duncan was seen and evaluated bedside with the help of a audio interpreter. He reported his cough has improved but he is still having a few episodes of hemoptysis. He denies chest pain, feeling febrile or having chills during the night. He understands the need to have a pulmonologist on board and the need for a bronchoscopy.     Review of Systems: Review of Systems  Constitutional: Negative for chills and fever.   Respiratory: Positive for cough and hemoptysis. Negative for shortness of breath.     No Known Allergies  OBJECTIVE: Vitals:   04/11/18 0507 04/11/18 1608 04/11/18 2026 04/12/18 0422  BP: 117/82 120/75 112/79 130/86  Pulse: 65 (!) 58 65 63  Resp: 17  19 16   Temp: (!) 97.5 F (36.4 C) 98.1 F (36.7 C) 97.8 F (36.6 C) (!) 97.3 F (36.3 C)  TempSrc: Oral Oral Oral Oral  SpO2: 100% 100% 99% 100%   There is no height or weight on file to calculate BMI.  Physical Exam  Constitutional: He is oriented to person, place, and time.  Cardiovascular: Normal rate and regular rhythm.  No murmur heard. Pulmonary/Chest: Effort normal. He has wheezes in the right upper field, the right middle field, the right lower field, the left upper field, the left middle field and the left lower field.  Neurological: He is alert and oriented to person, place, and time.  Psychiatric: He has a normal mood and affect. His behavior is normal.    Lab Results Lab Results  Component Value Date   WBC 7.5 04/12/2018   HGB 9.7 (L) 04/12/2018   HCT 30.7 (L) 04/12/2018   MCV 91.6 04/12/2018   PLT 394 04/12/2018    Lab Results  Component Value Date   CREATININE 0.63 04/12/2018   BUN 10 04/12/2018   NA 139 04/12/2018   K 3.6 04/12/2018   CL 107 04/12/2018   CO2 27 04/12/2018    Lab Results  Component Value Date   ALT 9 04/08/2018   AST 16 04/08/2018   ALKPHOS 54 04/08/2018   BILITOT 0.4 04/08/2018     Microbiology: Recent Results (from the past 240 hour(s))  Culture, blood (Routine X 2) w Reflex to ID Panel     Status: None (Preliminary result)   Collection Time: 04/07/18  3:45 PM  Result Value Ref Range Status   Specimen Description BLOOD RIGHT FOREARM  Final   Special Requests   Final    BOTTLES DRAWN AEROBIC AND ANAEROBIC Blood Culture adequate volume   Culture   Final    NO GROWTH 4 DAYS Performed at [facility], [place] [address], [place], [place] [address]    Report Status  PENDING  Incomplete  Culture, blood (Routine X 2) w Reflex to ID Panel     Status: None (Preliminary result)   Collection Time: 04/07/18  3:50 PM  Result Value Ref Range Status   Specimen Description BLOOD LEFT ANTECUBITAL  Final   Special Requests   Final    BOTTLES DRAWN AEROBIC AND ANAEROBIC Blood Culture results may not be optimal due to an inadequate volume of blood received in culture bottles   Culture   Final    NO GROWTH 4 DAYS Performed at [facility], [place] [address] [place], [place] [address]    Report Status PENDING  Incomplete  Acid Fast Smear (AFB)  Status: None   Collection Time: 04/07/18 10:42 PM  Result Value Ref Range Status   AFB Specimen Processing Concentration  Final   Acid Fast Smear Negative  Final    Comment: (NOTE) Performed At: [facility] [place], [place] [id number] Rush Farmer MD WY:[id number]    Source (AFB) SPUTUM  Final  Acid Fast Smear (AFB)     Status: None   Collection Time: 04/09/18 12:58 PM  Result Value Ref Range Status   AFB Specimen Processing Concentration  Final   Acid Fast Smear Negative  Final    Comment: (NOTE) Performed At: [facility] [place], [place] [id number] Rush Farmer MD VE:[id number]    Source (AFB) SPUTUM  Final    Comment: Performed at [facility], [place] [address], [place], [place] [address]  Acid Fast Smear (AFB)     Status: None   Collection Time: 04/10/18 12:41 AM  Result Value Ref Range Status   AFB Specimen Processing Concentration  Final   Acid Fast Smear Negative  Final    Comment: (NOTE) Performed At: [facility] [place], [place] [id number] Rush Farmer MD PT:[id number]    Source (AFB) EXPECTORATED SPUTUM  Final  Acid Fast Smear (AFB)     Status: None   Collection Time: 04/11/18 12:35 PM  Result Value Ref Range Status   AFB Specimen Processing Concentration  Final   Acid Fast Smear Negative  Final    Comment:  (NOTE) Performed At: [facility] [place], [place] [id number] Rush Farmer MD [id number]    Source (AFB) SPUTUM  Final    Comment: Performed at [facility], [place] [address], [place], [place] [address]    [place], [place] for Infectious Disease [facility] 04/12/2018, 11:20 AM

## 2018-04-12 NOTE — Progress Notes (Signed)
Initial Nutrition Assessment  DOCUMENTATION CODES:   Non-severe (moderate) malnutrition in context of acute illness/injury  INTERVENTION:   Increase Ensure Enlive po to TID, each supplement provides 350 kcal and 20 grams of protein  Magic cup TID with meals, each supplement provides 290 kcal and 9 grams of protein    NUTRITION DIAGNOSIS:   Moderate Malnutrition related to acute illness(TB vs cancer) as evidenced by moderate fat depletion, moderate muscle depletion.  GOAL:   Patient will meet greater than or equal to 90% of their needs  MONITOR:   PO intake, Supplement acceptance  REASON FOR ASSESSMENT:   Malnutrition Screening Tool    ASSESSMENT:   Pt from [place] who moved to [place] 1/19 with PMH of asthma now admitted with cough, hemoptysis and chest wall pain and is having work up done for TB vs nontuberculous mycobacterial infection vs malignancy.    Meal Completion: 100% Spoke with pt through interpreter.  Pt reports losing weight and not eating when he was "really sick" this was back in June. He does not know his usual weight, how much weight he lost or his height.  Per pt he likes all food and his appetite is good now. He likes the ensure.   Medications and labs reviewed    NUTRITION - FOCUSED PHYSICAL EXAM:    Most Recent Value  Orbital Region  Mild depletion  Upper Arm Region  Moderate depletion  Thoracic and Lumbar Region  Severe depletion  Buccal Region  Mild depletion  Temple Region  Mild depletion  Clavicle Bone Region  Moderate depletion  Clavicle and Acromion Bone Region  Mild depletion  Scapular Bone Region  Moderate depletion  Dorsal Hand  Severe depletion  Patellar Region  Mild depletion  Anterior Thigh Region  Mild depletion  Posterior Calf Region  No depletion  Edema (RD Assessment)  None  Hair  Reviewed  Eyes  Reviewed  Mouth  Reviewed  Skin  Reviewed  Nails  Reviewed       Diet Order:   Diet Order           Diet NPO time specified  Except for: Ice Chips  Diet effective ____        Diet regular Room service appropriate? Yes; Fluid consistency: Thin  Diet effective now          EDUCATION NEEDS:   No education needs have been identified at this time  Skin:  Skin Assessment: Reviewed RN Assessment  Last BM:  7/2  Height:   Ht Readings from Last 1 Encounters:  03/07/18 5' 8.5" (1.74 m)    Weight:   Wt Readings from Last 1 Encounters:  03/07/18 114 lb 6.4 oz (51.9 kg)    Ideal Body Weight:     BMI:  There is no height or weight on file to calculate BMI.  Estimated Nutritional Needs:   Kcal:  1900-2100  Protein:  85-110 grams   Fluid:  > 1.9 L/day  Maylon Peppers RD, LDN, CNSC [phone] Pager [phone] After Hours Pager

## 2018-04-13 ENCOUNTER — Encounter (HOSPITAL_COMMUNITY): Payer: Self-pay | Admitting: Pulmonary Disease

## 2018-04-13 ENCOUNTER — Inpatient Hospital Stay (HOSPITAL_COMMUNITY): Payer: Medicaid Other

## 2018-04-13 ENCOUNTER — Encounter (HOSPITAL_COMMUNITY)
Admission: EM | Disposition: A | Discharge status: Home / Self Care | Payer: Self-pay | Source: Home / Self Care | Attending: Family Medicine

## 2018-04-13 DIAGNOSIS — J181 Lobar pneumonia, unspecified organism: Secondary | ICD-10-CM

## 2018-04-13 HISTORY — PX: VIDEO BRONCHOSCOPY: SHX5072

## 2018-04-13 LAB — CBC
HCT: 30.5 % — ABNORMAL LOW (ref 39.0–52.0)
Hemoglobin: 9.6 g/dL — ABNORMAL LOW (ref 13.0–17.0)
MCH: 29.1 pg (ref 26.0–34.0)
MCHC: 31.5 g/dL (ref 30.0–36.0)
MCV: 92.4 fL (ref 78.0–100.0)
PLATELETS: 439 10*3/uL — AB (ref 150–400)
RBC: 3.3 MIL/uL — ABNORMAL LOW (ref 4.22–5.81)
RDW: 13.6 % (ref 11.5–15.5)
WBC: 9.6 10*3/uL (ref 4.0–10.5)

## 2018-04-13 LAB — BASIC METABOLIC PANEL
ANION GAP: 7 (ref 5–15)
BUN: 15 mg/dL (ref 8–23)
CALCIUM: 8.5 mg/dL — AB (ref 8.9–10.3)
CO2: 24 mmol/L (ref 22–32)
Chloride: 104 mmol/L (ref 98–111)
Creatinine, Ser: 0.74 mg/dL (ref 0.61–1.24)
GFR calc Af Amer: >60 mL/min (ref >60)
GLUCOSE: 128 mg/dL — AB (ref 70–99)
Potassium: 3.4 mmol/L — ABNORMAL LOW (ref 3.5–5.1)
SODIUM: 135 mmol/L (ref 135–145)

## 2018-04-13 SURGERY — VIDEO BRONCHOSCOPY WITHOUT FLUORO
Anesthesia: Moderate Sedation | Laterality: Bilateral

## 2018-04-13 MED ORDER — POTASSIUM CHLORIDE 20 MEQ PO PACK
20.0000 meq | PACK | Freq: Once | ORAL | Status: DC
  Filled 2018-04-13: qty 1

## 2018-04-13 MED ORDER — PHENYLEPHRINE HCL 0.25 % NA SOLN: 1.0000 | Freq: Four times a day (QID) | NASAL | Status: DC | PRN

## 2018-04-13 MED ORDER — LIDOCAINE HCL 2 % EX GEL: 1.0000 "application " | Freq: Once | CUTANEOUS | Status: DC

## 2018-04-13 MED ORDER — MIDAZOLAM HCL 5 MG/ML IJ SOLN
INTRAMUSCULAR | Status: AC
  Filled 2018-04-13: qty 2

## 2018-04-13 MED ORDER — ACETAMINOPHEN 325 MG PO TABS: 650.0000 mg | ORAL_TABLET | Freq: Four times a day (QID) | ORAL | Status: DC

## 2018-04-13 MED ORDER — PHENYLEPHRINE HCL 0.25 % NA SOLN
NASAL | Status: DC | PRN
  Administered 2018-04-13: 2 via NASAL

## 2018-04-13 MED ORDER — FENTANYL CITRATE (PF) 100 MCG/2ML IJ SOLN
INTRAMUSCULAR | Status: AC
  Filled 2018-04-13: qty 4

## 2018-04-13 MED ORDER — NICOTINE 7 MG/24HR TD PT24: 7.0000 mg | MEDICATED_PATCH | Freq: Every day | TRANSDERMAL | 0 refills | Status: DC

## 2018-04-13 MED ORDER — LIDOCAINE HCL (PF) 1 % IJ SOLN
INTRAMUSCULAR | Status: DC | PRN
  Administered 2018-04-13: 6 mL

## 2018-04-13 MED ORDER — SODIUM CHLORIDE 0.9 % IV SOLN
Freq: Once | INTRAVENOUS | Status: AC
  Administered 2018-04-13: 13:00:00 via INTRAVENOUS

## 2018-04-13 MED ORDER — BUTAMBEN-TETRACAINE-BENZOCAINE 2-2-14 % EX AERO: 1.0000 | INHALATION_SPRAY | Freq: Once | CUTANEOUS | Status: DC

## 2018-04-13 MED ORDER — MIDAZOLAM HCL 10 MG/2ML IJ SOLN
INTRAMUSCULAR | Status: DC | PRN
  Administered 2018-04-13: 2 mg via INTRAVENOUS
  Administered 2018-04-13: 1 mg via INTRAVENOUS

## 2018-04-13 MED ORDER — LIDOCAINE HCL URETHRAL/MUCOSAL 2 % EX GEL
CUTANEOUS | Status: DC | PRN
  Administered 2018-04-13: 1

## 2018-04-13 MED ORDER — AMOXICILLIN-POT CLAVULANATE 875-125 MG PO TABS: 1.0000 | ORAL_TABLET | Freq: Two times a day (BID) | ORAL | 0 refills | Status: AC

## 2018-04-13 MED ORDER — FENTANYL CITRATE (PF) 100 MCG/2ML IJ SOLN
INTRAMUSCULAR | Status: DC | PRN
  Administered 2018-04-13: 25 ug via INTRAVENOUS
  Administered 2018-04-13: 50 ug via INTRAVENOUS

## 2018-04-13 MED FILL — AMOX-CLAV 875-125 MG TABLET: 875-125 | 14 days supply | Qty: 28 | Fill #0

## 2018-04-13 NOTE — H&P (Signed)
[facility] PCCM  HPI: 72 y/o male presented with hemoptysis and he has a left upper lobe mass.  History of latent TB.  Here now for bronchoscopy.  Past Medical History:  Diagnosis Date  . Asthma    "since I was born" (Apr 29, 2018)  . Family history of adverse reaction to anesthesia    "sister operated on in 1970/12/07; she died; she was pregnant" (04/29/2018)  . Hemoptysis 04/07/2018     Family History  Family history unknown: Yes     Social History   Socioeconomic History  . Marital status: Married    Spouse name: Not on file  . Number of children: 10  . Years of education: Not on file  . Highest education level: Not on file  Occupational History  . Not on file  Social Needs  . Financial resource strain: Not on file  . Food insecurity:    Worry: Never true    Inability: Never true  . Transportation needs:    Medical: Not on file    Non-medical: Not on file  Tobacco Use  . Smoking status: Former Smoker    Packs/day: 0.12    Years: 46.00    Pack years: 5.52    Types: Cigarettes  . Smokeless tobacco: Never Used  . Tobacco comment: 2018-04-29 "stopped  in 02/2018"  Substance and Sexual Activity  . Alcohol use: Yes    Alcohol/week: 4.8 oz    Types: 8 Cans of beer per week    Frequency: Never    Comment: 04/29/18 "4 beers, 2 days/wk"  . Drug use: Never  . Sexual activity: Yes  Lifestyle  . Physical activity:    Days per week: Not on file    Minutes per session: Not on file  . Stress: Not on file  Relationships  . Social connections:    Talks on phone: Not on file    Gets together: Not on file    Attends religious service: Not on file    Active member of club or organization: Not on file    Attends meetings of clubs or organizations: Not on file    Relationship status: Not on file  . Intimate partner violence:    Fear of current or ex partner: Not on file    Emotionally abused: Not on file    Physically abused: Not on file    Forced sexual activity: Not on file  Other Topics  Concern  . Not on file  Social History Narrative  . Not on file     No Known Allergies   _0 @ Vitals:   04/12/18 12/07/44 04/13/18 0525 04/13/18 1247 04/13/18 1259  BP: 111/82 127/65    Pulse: 96 62 64   Resp: 20 19 (!) 21   Temp: 98.8 F (37.1 C) 98 F (36.7 C)    TempSrc: Oral Oral    SpO2: 100% 100% 99% 99%   RA  General:  Resting comfortably in bed HENT: NCAT OP clear PULM: CTA B, normal effort CV: RRR, no mgr GI: BS+, soft, nontender MSK: normal bulk and tone Neuro: awake, alert, no distress, MAEW  CT images reviewed: complete left upper lobe atelectasis, emphysema in remaining open lobes, bronchiectasis changes noted, some RLL scarring vs infiltrate  CBC    Component Value Date/Time   WBC 9.6 04/13/2018 0606   RBC 3.30 (L) 04/13/2018 0606   HGB 9.6 (L) 04/13/2018 0606   HCT 30.5 (L) 04/13/2018 0606   PLT 439 (H) 04/13/2018 0606  MCV 92.4 04/13/2018 0606   MCH 29.1 04/13/2018 0606   MCHC 31.5 04/13/2018 0606   RDW 13.6 04/13/2018 0606   LYMPHSABS 1.0 04/07/2018 1547   MONOABS 1.3 (H) 04/07/2018 1547   EOSABS 0.1 04/07/2018 1547   BASOSABS 0.1 04/07/2018 1547    Impression/plan: Left upper lobe collapse/multi-lobar infiltrate/latent TB/hemoptysis: plan bronchoscopy with BAL for culture and biopsy today  Roselie Awkward, MD [facility]M Pager: [phone] Cell: [phone] After 3pm or if no response, call [phone]

## 2018-04-13 NOTE — Progress Notes (Signed)
Family Medicine Teaching Service Daily Progress Note Intern Pager: [phone]  Patient name: Todd Duncan Medical record number: [id number] Date of birth: 1945/10/17 Age: 72 y.o. Gender: male  Primary Care Provider: System, Pcp Not In Consultants: Infectious disease Code Status: Full  Pt Overview and Major Events to Date:  Todd Duncan is a 72 y.o. male presenting with R sided chest wall pain and cough productive of bloody sputum. PMH is significant for asthma and tobacco use. High suspicion for TB.  Assessment and Plan:  Hemoptysis  possible active tuberculosis: History of fevers, chills, weight loss, cough productive of Todd Duncan blood, and previous positive quantiferon gold TB test without treatment support diagnosis of active TB. CXR was significant for new infiltrate in the lateral RLL, cystic changes in upper lobes, and increasing density in LUL.  - CT chest with multilobar PNA in LUL and RLL with chronic changes in upper lobes consistent w/ chronic TB, 1 cm lucent lesion in T3 vertebral body, and emphysema. S/p ceftriaxone and azithromycin for presumed CAP in the ED. - airborne precautions - f/u blood cultures - no growth, after 5 days - final read - Positive quantiferon gold January 2019 - previously treated for latent TB with ING and rifapentine (01/11/2018) reported to be compliant by health department RN - AFB smear of sputum x3 - three negatives - tylenol 650 mg Q6H, ibuprofen 200 mg Q6H alternating for costochondritis (not PRN d/t language barrier) - will continue to communicate with ID regarding best course for abx and other infectious w/u (e.g. Aspergillosis) - Pulm following, appreciate recs - BAL planned for 7/5 - Will not begin RIPE therapy unless positive TB confirmed, will Bronch on 7/5 and evaluate for other infectious causes (I.e. Aspergilloma, other fungal, cancer)  Vertebral Hemangioma (T3) 1 cm lesion of the T3 vertebral body MRI of thoracic spine to evaluate for  Pott's disease  - MRI revealed vertebral hemangioma in T3, not concerning for infectious etiology  Hyponatremia: Resolved. Sodium of 129 on admission, corrected this am to 135. Likely low due to extra-sensory loses due to active infection. - Cont NS @ 100 ml/hr  Emphysema: patient told he has asthma, but he likely has emphysema given changes seen on CXR and CT and tobacco use history. - may benefit from Spiriva on discharge  Tobacco use: patient smokes about 4 cigarettes per day.  Used to smoke more, has a 50 year smoking history. - Nicotine patch daily  FEN/GI: reg diet PPx: lovenox  Disposition: FPTS   Subjective:  Spoke with pt this am through audio translator (Swahili). No new complaints this am.  Spoke with pt more about bronch today and answered his questions.  Pt was able to speak with one of this children today and feels comfortable going through with the procedure today.    Objective: Temp:  [97.5 F (36.4 C)-98.8 F (37.1 C)] 98 F (36.7 C) (07/05 0525) Pulse Rate:  [62-96] 62 (07/05 0525) Resp:  [19-20] 19 (07/05 0525) BP: (111-131)/(65-85) 127/65 (07/05 0525) SpO2:  [100 %] 100 % (07/05 0525) Physical Exam: General: Alert and cooperative and appears to be in no acute distress HEENT: Neck non-tender without lymphadenopathy, masses or thyromegaly Cardio: Normal A1 and S2, no S3 or S4. Rhythm is regular. 8-8/8 systolic murmur at left sternal border. no rubs.  Pulm: Clear to auscultation bilaterally, no crackles, wheezing, or diminished breath sounds. Normal respiratory effort Abdomen: Bowel sounds normal. Abdomen soft and non-tender.  Extremities: No peripheral edema. Warm/ well perfused.  Strong  radial and pedal pulses. Neuro: Cranial nerves grossly intact  Laboratory: Recent Labs  Lab 04/10/18 0448 04/11/18 0621 04/12/18 0512  WBC 6.3 5.6 7.5  HGB 9.1* 9.8* 9.7*  HCT 29.5* 31.4* 30.7*  PLT 307 387 394   Recent Labs  Lab 04/07/18 1547 04/08/18 0841   04/10/18 0448 04/11/18 0621 04/12/18 0512  NA 129* 135   < > 136 139 139  K 4.1 4.0   < > 3.7 3.7 3.6  CL 98 105   < > 110 109 107  CO2 23 25   < > _0 BUN 11 13   < > _1 CREATININE 0.85 0.71   < > 0.66 0.72 0.63  CALCIUM 8.4* 8.2*   < > 7.5* 8.1* 8.6*  PROT 7.9 7.0  --   --   --   --   BILITOT 0.9 0.4  --   --   --   --   ALKPHOS 56 54  --   --   --   --   ALT 11 9  --   --   --   --   AST 18 16  --   --   --   --   GLUCOSE 97 127*   < > 89 89 89   < > = values in this interval not displayed.   6/29 Lactic Acid 0.85 6/29 Blood cultures x2 pending 6/29 BNP 105.5 6/29 Troponin I negative x1 6/29 INR 1.21 6/29 aPPT 37 6/29 HIV non-reactive 6/29 AFB smear - negative 6/29 Acid Fast Culture w/sensitivities - in process 7/1  AFB smear - negative 7/2  AFB smear - negative  Imaging/Diagnostic Tests: CT CHEST WITH CONTRAST (04/07/2018) 1. 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 2. Nonspecific 1 cm lucent lesion in the T3 vertebral body. Consider non-emergent bone scan or thoracic spine MRI with contrast for further evaluation. 3. Emphysema (ICD10-J43.9).  PORTABLE CHEST 1 VIEW (04/07/2018) 1. New infiltrate in the lateral right lung base. 2. Cystic changes in the upper lobes correlate with the reported history of previous tuberculosis. 3. Increasing density in the left upper lobe could be positional or technical. A developing infiltrate is not excluded. A PA and lateral chest x-ray could better evaluate.  No results found.   Todd Haymaker, MD 04/13/2018, 6:21 AM PGY-1, [place] Intern pager: [phone], text pages welcome

## 2018-04-13 NOTE — Care Management (Signed)
Spoke with OP Pharm they will waive Augmentin co pay . MD will pick Augmentin up for patient. Magdalen Spatz RN [phone]

## 2018-04-13 NOTE — Progress Notes (Signed)
Discharge home. Home discharge instruction given, no question verbalized. Family at bedside during discharge, health teaching was given regarding home isolation as instructed by the health department. Instructed patient to stay home until called by the health department and if needed to go out, he needs to have his car window open when driving specially when with group of people in the car. Family and Patient verbalizes understanding. Patient was given a mask as well to use at home . Patient went with meds that was also given by the hospital, instructed on how to take it, he verbalizes understanding.

## 2018-04-13 NOTE — Progress Notes (Signed)
[facility]  Date of Admission:  04/07/2018   Total days of antibiotics 1        Azithromycin Day 1        Ceftriaxone Day 1         ASSESSMENT: Todd Duncan is a 72 year old male from [place], recently moved here from [place], who was admitted due to hemoptysis and cough. He had a positive QuantiFERON gold TB assay earlier this year and CT scan showed concerns for multilobar pneumonia involving the upper and right lower lobes superimposed on chronic changes in the bilateral upper lobes related to chronic mycobacterial infection.Since admission he has had 3 negative sputum AFB smears.This could be due to patient being on partial therapy of INH and rifapentine for 5 weeks starting on 01/11/18. We are still waiting on culturesbut even if AFB culturesand smearsare negative active TB cannot be ruled out.   He stated he is feeling better and having decreased blood in his sputum when he coughs. He also said his chest pain and cough have improved. Although this is a very typical appearance of active pulmonary tuberculosis we still cannot rule out nontuberculous mycobacterial infection, nocardia or malignancy.He is scheduled for a bronchoscopy for 2 pm this afternoon. We will observe off of antibiotics for now and wait for specimen results from bronchoscopy.   PLAN: 1. Will continue observation off antibiotics for now 2. Will await specimen results (routine gram, AFB, fungal stains and cultures, routine path if biopsy is done) from bronchoscopy   Principal Problem:   Hemoptysis Active Problems:   COPD exacerbation (HCC)   Asthma   Positive QuantiFERON-TB Gold test   Normocytic anemia   Multifocal pneumonia   Tobacco abuse   Malnutrition of moderate degree   Scheduled Meds: . acetaminophen  650 mg Oral Q6H  . feeding supplement (ENSURE ENLIVE)  237 mL Oral TID BM  . ibuprofen  200 mg Oral QID  . nicotine  7 mg Transdermal Daily  . potassium chloride  20  mEq Oral Once   Continuous Infusions: . sodium chloride 100 mL/hr at 04/13/18 0520   PRN Meds:.polyethylene glycol   SUBJECTIVE: Todd Duncan is a 72 yo male originally from [place], who moved here from [place] 7 months ago, who presented to the ED on 04/07/18 with epistaxis and a cough for three weeks. Patient was also experiencing right sided chest pain, fever, chills, weight loss and SOB. He found blood in his sputum and was experiencing episodic nosebleeds from his right nare for 3 days prior to his admission. He was given ceftriaxone and azithromycin in the ED for presumed CAP.   He was evaluated by the health department earlier this year and found to have a positive QuantiFERON gold TB assay was positive.His sputum samples and AFB smears and cultures were negative at that time. He had some upper lobe scarring on chest x-ray. He was diagnosed with latent tuberculosis and started on INH and rifapentine on 01/11/2018.   He stated he was diagnosed with asthma since he was born and has had a chronic cough since the age of 75. He denied any recent traveling besides moving here from [place] 7 months ago. He also denied any sick contacts.He reported an episode of fever and drenching night sweats the night he was admitted. He also confirmed that he took the [place] and rifapentine therapy for about 5 weeks. It seems his symptoms arose shortly after discontinuing therapy.  Today, Todd Duncan was seen and evaluated bedside with the help of a audio interpreter. He reported he is feeling better and that he is having decreased blood in his sputum. His cough has improved. He denies chest pain, feeling febrile or having chills during the night. He is scheduled to have his bronchoscopy at 2 pm today and stated he understands the purpose of this procedure.    Review of Systems: Review of Systems  Constitutional: Negative for chills and fever.  Respiratory: Positive for cough and hemoptysis. Negative for shortness  of breath.   Cardiovascular: Negative for chest pain.    No Known Allergies  OBJECTIVE: Vitals:   04/12/18 0422 04/12/18 1339 04/12/18 2046 04/13/18 0525  BP: 130/86 131/85 111/82 127/65  Pulse: 63 68 96 62  Resp: 16 20 20 19   Temp: (!) 97.3 F (36.3 C) (!) 97.5 F (36.4 C) 98.8 F (37.1 C) 98 F (36.7 C)  TempSrc: Oral Oral Oral Oral  SpO2: 100% 100% 100% 100%   There is no height or weight on file to calculate BMI.  Physical Exam  Constitutional: He is oriented to person, place, and time.  Cardiovascular: Normal rate and regular rhythm. Exam reveals no gallop.  No murmur heard. Pulmonary/Chest: Effort normal. He has wheezes in the right upper field, the right middle field, the right lower field, the left upper field, the left middle field and the left lower field.  Neurological: He is alert and oriented to person, place, and time.  Psychiatric: He has a normal mood and affect. His behavior is normal.    Lab Results Lab Results  Component Value Date   WBC 9.6 04/13/2018   HGB 9.6 (L) 04/13/2018   HCT 30.5 (L) 04/13/2018   MCV 92.4 04/13/2018   PLT 439 (H) 04/13/2018    Lab Results  Component Value Date   CREATININE 0.74 04/13/2018   BUN 15 04/13/2018   NA 135 04/13/2018   K 3.4 (L) 04/13/2018   CL 104 04/13/2018   CO2 24 04/13/2018    Lab Results  Component Value Date   ALT 9 04/08/2018   AST 16 04/08/2018   ALKPHOS 54 04/08/2018   BILITOT 0.4 04/08/2018     Microbiology: Recent Results (from the past 240 hour(s))  Culture, blood (Routine X 2) w Reflex to ID Panel     Status: None   Collection Time: 04/07/18  3:45 PM  Result Value Ref Range Status   Specimen Description BLOOD RIGHT FOREARM  Final   Special Requests   Final    BOTTLES DRAWN AEROBIC AND ANAEROBIC Blood Culture adequate volume   Culture   Final    NO GROWTH 5 DAYS Performed at [facility], [address] [place], [place] [address]    Report Status 04/12/2018 FINAL  Final    Culture, blood (Routine X 2) w Reflex to ID Panel     Status: None   Collection Time: 04/07/18  3:50 PM  Result Value Ref Range Status   Specimen Description BLOOD LEFT ANTECUBITAL  Final   Special Requests   Final    BOTTLES DRAWN AEROBIC AND ANAEROBIC Blood Culture results may not be optimal due to an inadequate volume of blood received in culture bottles   Culture   Final    NO GROWTH 5 DAYS Performed at [facility], [place] [address], [place], [place] [address]    Report Status 04/12/2018 FINAL  Final  Acid Fast Smear (AFB)  Status: None   Collection Time: 04/07/18 10:42 PM  Result Value Ref Range Status   AFB Specimen Processing Concentration  Final   Acid Fast Smear Negative  Final    Comment: (NOTE) Performed At: [facility] [place], [place] [id number] Rush Farmer MD TO:[id number]    Source (AFB) SPUTUM  Final  Acid Fast Smear (AFB)     Status: None   Collection Time: 04/09/18 12:58 PM  Result Value Ref Range Status   AFB Specimen Processing Concentration  Final   Acid Fast Smear Negative  Final    Comment: (NOTE) Performed At: [facility] [place], [place] [id number] Rush Farmer MD [id number]    Source (AFB) SPUTUM  Final    Comment: Performed at [facility], [place] [address], [place], [place] [address]  Acid Fast Smear (AFB)     Status: None   Collection Time: 04/10/18 12:41 AM  Result Value Ref Range Status   AFB Specimen Processing Concentration  Final   Acid Fast Smear Negative  Final    Comment: (NOTE) Performed At: [facility] [place], [place] [id number] Rush Farmer MD AS:[id number]    Source (AFB) EXPECTORATED SPUTUM  Final  Acid Fast Smear (AFB)     Status: None (Preliminary result)   Collection Time: 04/10/18  4:41 PM  Result Value Ref Range Status   AFB Specimen Processing Concentration  Final   Acid Fast Smear Negative  Final    Comment:  (NOTE) Performed At: [facility] [place], [place] [id number] Rush Farmer MD [id number] Performed at [facility], [place] [address] [place], [place] [address]    Source (AFB) PENDING  Incomplete  Acid Fast Smear (AFB)     Status: None   Collection Time: 04/11/18 12:35 PM  Result Value Ref Range Status   AFB Specimen Processing Concentration  Final   Acid Fast Smear Negative  Final    Comment: (NOTE) Performed At: [facility] [place], [place] [id number] Rush Farmer MD FO:[id number]    Source (AFB) SPUTUM  Final    Comment: Performed at [facility], [place] [address] [place], [place] [address]    [facility]  04/13/2018, 9:46 AM

## 2018-04-13 NOTE — Op Note (Signed)
[facility] Cardiopulmonary Patient Name: Todd Duncan Date: 04/13/2018 MRN: [id number] Attending MD: Juanito Doom , MD Date of Birth: 30-Mar-1946 CSN: Finalized Age: 72 Admit Type: Inpatient Gender: Male Procedure:            Bronchoscopy Indications:          Hemoptysis with abnormal CXR, Bronchiectasis,                        Tuberculosis Providers:            Nathaneil Canary B. Kinley Dozier, MD, Ashley Mariner RRT,RCP, Phillis Knack RRT, RCP Referring MD:          Medicines:            Midazolam 3 mg mg IV, Fentanyl 75 mcg IV Complications:        No immediate complications Estimated Blood Loss: Estimated blood loss: none. Procedure:            Pre-Anesthesia Assessment:                       - A History and Physical has been performed. Patient                        meds and allergies have been reviewed. The risks and                        benefits of the procedure and the sedation options and                        risks were discussed with the patient. All questions                        were answered and informed consent was obtained.                        Patient identification and proposed procedure were                        verified prior to the procedure by the physician and                        the technician in the procedure room. Mental Status                        Examination: normal. Airway Examination: normal                        oropharyngeal airway. Respiratory Examination: clear to                        auscultation. CV Examination: RRR, no murmurs, no S3 or                        S4. ASA Grade Assessment: II - A patient with mild                        systemic disease. After reviewing the risks and  benefits, the patient was deemed in satisfactory                        condition to undergo the procedure. The anesthesia plan                        was to use moderate sedation / analgesia  (conscious                        sedation). Immediately prior to administration of                        medications, the patient was re-assessed for adequacy                        to receive sedatives. The heart rate, respiratory rate,                        oxygen saturations, blood pressure, adequacy of                        pulmonary ventilation, and response to care were                        monitored throughout the procedure. The physical status                        of the patient was re-assessed after the procedure.                       After obtaining informed consent, the bronchoscope was                        passed under direct vision. Throughout the procedure,                        the patient's blood pressure, pulse, and oxygen                        saturations were monitored continuously. the [id number]                        [id number] scope was introduced through the right nostril                        and advanced to the tracheobronchial tree. The                        procedure was accomplished without difficulty. The                        patient tolerated the procedure well. The total                        duration of the procedure was 15 minutes. Scope In: 1:25:39 PM Scope Out: 1:40:37 PM Findings:      The nasopharynx/oropharynx appears normal. The larynx appears normal.       The vocal cords appear normal. The subglottic space is normal. The       trachea is of normal caliber. The carina  is sharp. The tracheobronchial       tree of the right lung was examined to at least the first subsegmental       level. Bronchial mucosa and anatomy in the right lung are normal; there       are no endobronchial lesions, and no secretions.      Left Lung Abnormalities: Copious, purulent, tenacious secretions were       found in the left upper lobe. They were not obstructing the airway.       Transbronchial biopsies were performed in the superior lingula segment       of  the left upper lobe and in the inferior lingula segment of the left       upper lobe using forceps and sent for histopathology examination. The       procedure was guided by fluoroscopy. Transbronchial biopsy technique was       selected because the sampling site was not visible endoscopically. Five       biopsy passes were performed. Five biopsy samples were obtained. BAL was       performed in the left upper lobe of the lung and sent for routine       cytology and bacterial, AFB and fungal analysis. 30 mL of fluid were       instilled. 25 mL were returned. The return was mucopurulent. Mucous       plugs were present in the return fluid. Fluoroscopy guided       transbronchial brushings were obtained in the superior lingula segment       of the left upper lobe with a cytology brush and sent for routine       cytology. Two samples were obtained. BAL was performed in the RLL       anterior basal segment (B8) of the lung and sent for bacterial, AFB and       fungal analysis. 30 mL of fluid were instilled. 20 mL were returned. The       return was turbid. There were no mucoid plugs in the return fluid.      The remainder of the tracheobronchial tree is normal. Impression:           - Hemoptysis with abnormal CXR                       - Bronchiectasis                       - Tuberculosis                       - The airway examination of the right lung was normal.                       - Copious, purulent, tenacious secretions were found in                        the left upper lobe.                       - Transbronchial lung biopsies were performed.                       - Bronchoalveolar lavage was performed.                       -  Transbronchial brushings were obtained.                       - Bronchoalveolar lavage was performed. Moderate Sedation:      Moderate (conscious) sedation was personally administered by the       pulmonologist. The following parameters were monitored: oxygen        saturation, heart rate, blood pressure, and response to care. Total       physician intraservice time was 20 minutes. Recommendation:       - Await biopsy, culture and cytology results. Procedure Code(s):    --- Professional ---                       [phone], Bronchoscopy, rigid or flexible, including                        fluoroscopic guidance, when performed; with                        transbronchial lung biopsy(s), single lobe                       [id number], Bronchoscopy, rigid or flexible, including                        fluoroscopic guidance, when performed; with bronchial                        alveolar lavage                       [phone], Bronchoscopy, rigid or flexible, including                        fluoroscopic guidance, when performed; with bronchial                        alveolar lavage                       [phone], Bronchoscopy, rigid or flexible, including                        fluoroscopic guidance, when performed; with brushing or                        protected brushings Diagnosis Code(s):    --- Professional ---                       R04.2, Hemoptysis                       R91.8, Other nonspecific abnormal finding of lung field                       J47.9, Bronchiectasis, uncomplicated                       A15.9, Respiratory tuberculosis unspecified                       R09.89, Other specified symptoms and signs involving  the circulatory and respiratory systems CPT copyright 2017 American Medical Association. All rights reserved. The codes documented in this report are preliminary and upon coder review may  be revised to meet current compliance requirements. Norlene Campbell, MD Juanito Doom, MD 04/13/2018 1:55:32 PM This report has been signed electronically. Number of Addenda: 0

## 2018-04-13 NOTE — Progress Notes (Signed)
[facility]  Bronchoscopy performed today No airway mass, some bloody secretions bilaterally, but mostly purulent secretions I suspect the bleeding is due to bronchiectasis with acute exacerbation  Would d/c home on oral antibiotics for a bronchiectasis flare: Augmentin 875 bid x 14 days  Roselie Awkward, MD [facility] Pager: [phone] Cell: [phone] After 3pm or if no response, call [phone]

## 2018-04-13 NOTE — Progress Notes (Signed)
Spoke with ID regarding pulmonology recommendation for discharge to home with Augmentin 875 bid x 14 days for bronchiectasis flare.  ID called to [facility] to notify them of patient's discharge, left message for them to follow up. ID agrees that discharging patient today on Augmentin is approproiate.

## 2018-04-13 NOTE — Progress Notes (Addendum)
[facility]  S: he says he feels OK, still coughing up blood  O:  Vitals:   04/12/18 0422 04/12/18 1339 04/12/18 2046 04/13/18 0525  BP: 130/86 131/85 111/82 127/65  Pulse: 63 68 96 62  Resp: 16 20 20 19   Temp: (!) 97.3 F (36.3 C) (!) 97.5 F (36.4 C) 98.8 F (37.1 C) 98 F (36.7 C)  TempSrc: Oral Oral Oral Oral  SpO2: 100% 100% 100% 100%   RA  General:  Resting comfortably in bed HENT: NCAT OP clear PULM: CTA B, normal effort CV: RRR, no mgr GI: BS+, soft, nontender MSK: normal bulk and tone Neuro: awake, alert, no distress, MAEW  CT images reviewed: RLL infiltrate, LUL mass, emphysema worse LUL  Micro 6/29 blood >  6/29 sputum AFB smear neg >  7/1 Sputum AFB smear neg >  7/2 sputum AFB smear neg >  7/2 sputum AFB smear neg >  7/3 sputum AFB smear neg >   Impression/plan:  Left upper lobe mass-like consolidation in a smoker from the [place]> 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  He says that he will try to get family here today by noon.  Plan to do the procedure at 2:00.  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  Will still plan bronchoscopy with biopsy given concern for possible malignancy.   Roselie Awkward, MD [facility] Pager: [phone] Cell: [phone] After 3pm or if no response, call [phone]

## 2018-04-13 NOTE — Progress Notes (Signed)
Video bronchoscopy performed.  Intervention bronchial washing. Intervention bronchial biopsy. Intervention bronchial brushing.  Patient tolerated procedure well.  No complications noted.

## 2018-04-14 NOTE — Discharge Summary (Signed)
[facility] Discharge Summary  Patient name: Todd Duncan Medical record number: [id number] Date of birth: 1946-04-18 Age: 72 y.o. Gender: male Date of Admission: 04/07/2018  Date of Discharge: 04/13/2018 Admitting Physician: Todd Covert, MD  Primary Care Provider: Matilde Haymaker, MD Consultants: Infectious disease, pulmonology  Indication for Hospitalization: hemoptysis  Discharge Diagnoses/Problem List:  Patient Active Problem List   Diagnosis Date Noted  . Malnutrition of moderate degree 04/12/2018  . Multifocal pneumonia   . Tobacco abuse   . Asthma 04/10/2018    Class: Chronic  . COPD exacerbation ([place]) 04/10/2018  . Normocytic anemia 04/09/2018  . Hemoptysis 04/07/2018  . 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 Rhythm is regular. 0-7/6 systolic murmur at left sternal border. no rubs.  Pulm: Clear to auscultation bilaterally, no crackles, wheezing, or diminished breath sounds. Normal respiratory effort Abdomen: Bowel sounds normal. Abdomen soft and non-tender.  Extremities: No peripheral edema. Warm/ well perfused.  Strong radial and pedal pulses. Neuro: Cranial nerves grossly intact  Brief Hospital Course:  [name] is a 72 year old male who Grew up in the [place] in addition to little bit of time in [place] before coming to the [place] about 7 months ago.  Shortly after arriving in the [place] he had a positive QuantiFERON gold test and was later treated for latent TB.  He has been reportedly compliant with the latent TB treatment according to health department RN.  On 6/29 he was admitted to the hospital for hemoptysis with concern for active TB.  During his admission he had a  small battery of tests which did not show evidence for active TB.  On 7/5 he had a bronchioloalveolar washing, brushing and biopsy. from which a variety of tests are still pending for further work-up of other infectious etiologies.  Pulmonology noted that the most likely etiology for hemoptysis is due to a bronchiectasis flare.  Patient will be sent home on Augmentin 875 twice daily for 14 days (7/5-7/19).  Issues for Follow Up:  1. Follow up w/ PCP regarding pending labs. And potential further treatment/workup. 2. Follow up with the health department regarding appropriate treatment for latent TB  Significant Procedures:  BAL with washing, brushing and biopsy (7/5) - without complications  Significant Labs and Imaging:  Recent Labs  Lab 04/11/18 0621 04/12/18 0512 04/13/18 0606  WBC 5.6 7.5 9.6  HGB 9.8* 9.7* 9.6*  HCT 31.4* 30.7* 30.5*  PLT 387 394 439*   Recent Labs  Lab 04/07/18 1547 04/08/18 0841 04/09/18 0715 04/10/18 0448 04/11/18 0621 04/12/18 0512 04/13/18 0606  NA 129* 135 140 136 139 139 135  K 4.1 4.0 3.8 3.7 3.7 3.6 3.4*  CL 98 105 110 110 109 107 104  CO2 _0 GLUCOSE 97 127* 88 89 89 89 128*  BUN _1 CREATININE 0.85 0.71 0.64 0.66 0.72 0.63 0.74  CALCIUM 8.4* 8.2* 7.9* 7.5* 8.1* 8.6* 8.5*  ALKPHOS 56 54  --   --   --   --   --   AST 18 16  --   --   --   --   --   ALT 11 9  --   --   --   --   --  ALBUMIN 2.7* 2.2*  --   --   --   --   --    Results for orders placed or performed during the hospital encounter of 04/07/18  Culture, blood (Routine X 2) w Reflex to ID Panel     Status: None   Collection Time: 04/07/18  3:45 PM  Result Value Ref Range Status   Specimen Description BLOOD RIGHT FOREARM  Final   Special Requests   Final    BOTTLES DRAWN AEROBIC AND ANAEROBIC Blood Culture adequate volume   Culture   Final    NO GROWTH 5 DAYS Performed at [facility], [place] [address] [place], [place] [address]     Report Status 04/12/2018 FINAL  Final  Culture, blood (Routine X 2) w Reflex to ID Panel     Status: None   Collection Time: 04/07/18  3:50 PM  Result Value Ref Range Status   Specimen Description BLOOD LEFT ANTECUBITAL  Final   Special Requests   Final    BOTTLES DRAWN AEROBIC AND ANAEROBIC Blood Culture results may not be optimal due to an inadequate volume of blood received in culture bottles   Culture   Final    NO GROWTH 5 DAYS Performed at [facility], [place] [address], [place], [place] [address]    Report Status 04/12/2018 FINAL  Final  Acid Fast Smear (AFB)     Status: None   Collection Time: 04/07/18 10:42 PM  Result Value Ref Range Status   AFB Specimen Processing Concentration  Final   Acid Fast Smear Negative  Final    Comment: (NOTE) Performed At: [facility] [place], [place] [id number] Rush Farmer MD HQ:[id number]    Source (AFB) SPUTUM  Final  Acid Fast Smear (AFB)     Status: None   Collection Time: 04/09/18 12:58 PM  Result Value Ref Range Status   AFB Specimen Processing Concentration  Final   Acid Fast Smear Negative  Final    Comment: (NOTE) Performed At: [facility] [place], [place] [id number] Rush Farmer MD VO:[id number]    Source (AFB) SPUTUM  Final    Comment: Performed at [facility], [facility] [address] [place], [place] [address]  Acid Fast Smear (AFB)     Status: None   Collection Time: 04/10/18 12:41 AM  Result Value Ref Range Status   AFB Specimen Processing Concentration  Final   Acid Fast Smear Negative  Final    Comment: (NOTE) Performed At: [facility] [place], [place] [id number] Rush Farmer MD IR:[id number]    Source (AFB) EXPECTORATED SPUTUM  Final  Acid Fast Smear (AFB)     Status: None (Preliminary result)   Collection Time: 04/10/18  4:41 PM  Result Value Ref Range Status   AFB Specimen Processing Concentration  Final   Acid Fast Smear  Negative  Final    Comment: (NOTE) Performed At: [facility] [place], [place] [id number] Rush Farmer MD AT:[id number] Performed at [facility], [place] [address], [place], [place] [address]    Source (AFB) PENDING  Incomplete  Acid Fast Smear (AFB)     Status: None   Collection Time: 04/11/18 12:35 PM  Result Value Ref Range Status   AFB Specimen Processing Concentration  Final   Acid Fast Smear Negative  Final    Comment: (NOTE) Performed At: [facility] [address] [place], [place] [id number] Rush Farmer  MD KZ:[id number]    Source (AFB) SPUTUM  Final    Comment: Performed at [facility], [place] [address] [place], [place] [address]  Culture, bal-quantitative     Status: None (Preliminary result)   Collection Time: 04/13/18  1:04 PM  Result Value Ref Range Status   Specimen Description BRONCHIAL ALVEOLAR LAVAGE  Final   Special Requests RIGHT LUNG  Final   Gram Stain   Final    ABUNDANT WBC PRESENT, PREDOMINANTLY PMN NO ORGANISMS SEEN    Culture   Final    CULTURE REINCUBATED FOR BETTER GROWTH Performed at [facility], [place] [address], [place], [place] [address]    Report Status PENDING  Incomplete  Culture, bal-quantitative     Status: None (Preliminary result)   Collection Time: 04/13/18  1:45 PM  Result Value Ref Range Status   Specimen Description BRONCHIAL ALVEOLAR LAVAGE  Final   Special Requests NONE  Final   Gram Stain   Final    ABUNDANT WBC PRESENT, PREDOMINANTLY PMN RARE GRAM POSITIVE COCCI    Culture   Final    CULTURE REINCUBATED FOR BETTER GROWTH Performed at [facility], [place] [address] [place], [place] [address]    Report Status PENDING  Incomplete     Ct Chest W Contrast  Result Date: 04/07/2018 CLINICAL DATA:  Productive cough with bloody sputum. History of tuberculosis. EXAM: CT CHEST WITH CONTRAST TECHNIQUE: Multidetector CT imaging of the chest was performed during intravenous  contrast administration. CONTRAST:  155m OMNIPAQUE IOHEXOL 300 MG/ML  SOLN COMPARISON:  Multiple prior chest x-rays, most recently from today. FINDINGS: Cardiovascular: Normal heart size. No pericardial effusion. Normal caliber thoracic aorta. No aortic dissection. No central pulmonary embolism. Mediastinum/Nodes: Borderline enlarged left hilar and subcarinal lymph nodes, measuring 1.0 cm in short axis. No axillary lymphadenopathy. The thyroid gland, trachea, and esophagus demonstrate no significant findings. Lungs/Pleura: Volume loss in the left upper lobe. Bronchiectasis, architectural distortion, and bullous changes in both upper lobes. Prominent non-enhancing consolidation involving the majority of the left upper lobe with solid material filling the bullae at the left lung apex. Scattered peribronchovascular nodularity in the left lower lobe. Small right pleural effusion with right lower lobe ground-glass density and consolidation. Mild emphysema. Upper Abdomen: No acute abnormality. Musculoskeletal: No chest wall abnormality. No acute osseous findings. Small oval lucent lesion in the T3 vertebral body, nonspecific. IMPRESSION: 1. 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 2. Nonspecific 1 cm lucent lesion in the T3 vertebral body. Consider non-emergent bone scan or thoracic spine MRI with contrast for further evaluation. 3.  Emphysema (ICD10-J43.9). Electronically Signed   By: WTitus DubinM.D.   On: 04/07/2018 18:11   Dg Chest Portable 1 View  Result Date: 04/07/2018 CLINICAL DATA:  Persistent nose bleeds. Patient on latent TB medications. EXAM: PORTABLE CHEST 1 VIEW COMPARISON:  October 17, 2017 and Mar 07, 2018 FINDINGS: Cystic changes are seen in the apices, left greater than right. There is increased density over the left apex. There is a new infiltrate in the lateral  right lung base. Volume loss on the left persists. No other interval changes. No pneumothorax. IMPRESSION: 1. New infiltrate in the lateral right lung base. 2. Cystic changes in the upper lobes correlate with the reported history of previous tuberculosis. 3. Increasing density in the left upper lobe could be positional or technical. A  developing infiltrate is not excluded. A PA and lateral chest x-ray could better evaluate. Electronically Signed   By: Dorise Bullion III M.D   On: 04/07/2018 14:07     Results/Tests Pending at Time of Discharge:  BAL acid fast culture (7/5) BAL acid fast smear (7/5) BAL culture (7/5) BAL fungal culture (7/5) Surgical pathology (7/5) Cytology - bronchial brushing (7/5) Cytology - BAL (7/5)  Discharge Medications:  Allergies as of 04/13/2018   No Known Allergies     Medication List    STOP taking these medications   isoniazid 300 MG tablet Commonly known as:  NYDRAZID   PRIFTIN 150 MG Tabs Generic drug:  Rifapentine   pyridOXINE 50 MG tablet Commonly known as:  VITAMIN B-6     TAKE these medications   acetaminophen 325 MG tablet Commonly known as:  TYLENOL Take 2 tablets (650 mg total) by mouth every 6 (six) hours.   amoxicillin-clavulanate 875-125 MG tablet Commonly known as:  AUGMENTIN Take 1 tablet by mouth 2 (two) times daily for 14 days.   nicotine 7 mg/24hr patch Commonly known as:  NICODERM CQ - dosed in mg/24 hr Place 1 patch (7 mg total) onto the skin daily.       Discharge Instructions: Please refer to Patient Instructions section of EMR for full details.  Patient was counseled important signs and symptoms that should prompt return to medical care, changes in medications, dietary instructions, activity restrictions, and follow up appointments.   Follow-Up Appointments:   Todd Haymaker, MD 04/14/2018, 10:47 AM PGY-1, [place]

## 2018-04-15 LAB — CULTURE, BAL-QUANTITATIVE W GRAM STAIN
Culture: >=100000 — AB
Culture: 20000 — AB

## 2018-04-15 LAB — CULTURE, BAL-QUANTITATIVE

## 2018-04-17 LAB — ACID FAST SMEAR (AFB, MYCOBACTERIA)
Acid Fast Smear: NEGATIVE
Acid Fast Smear: NEGATIVE
Acid Fast Smear: NEGATIVE

## 2018-04-18 NOTE — Congregational Nurse Program (Signed)
Congregational Nurse Program Note  Date of Encounter: 03/27/2018  Past Medical History: Past Medical History:  Diagnosis Date  . Asthma    "since I was born" (04/12/18)  . Family history of adverse reaction to anesthesia    "sister operated on in 1971-01-05; she died; she was pregnant" (04-12-18)  . Hemoptysis 04/07/2018    Encounter Details: CNP Questionnaire - 04/18/18 1150      Questionnaire   Patient Status  Refugee    Race  African    Location Patient Served At  [employer]   Medicaid    Uninsured  Not Applicable    Food  No food insecurities    Housing/Utilities  Yes, have permanent housing    Transportation  Yes, need transportation assistance    Interpersonal Safety  Yes, feel physically and emotionally safe where you currently live    Medication  No medication insecurities    Medical Provider  No    Referrals  Other    ED Visit Averted  Not Applicable    Life-Saving Intervention Made  Not Applicable       Observed patient returned to classroom today at [employer], after recent hospital discharge. Spoke to nurse at [place] office regarding precautions and follow-up care. Informed that patient continues to wait for final tests results. Patient requested from classroom setting and assessed. Very thin appearing and pleasant man speaking Swahili through use of phone interpreter.No coughing or wheezing observed today. Bilateral chest sounds with no wheezing or congestion noted. Afebrile. Denies loss of appetite. Medications taken as directed.  States he feels ok to return to school. Expressed concern about missing English classes and Work Building services engineer for reimbursement; no income. Education provided on airborne droplets. Stressed repeated handwashing and use of mask for protection of family members if coughing. Emphasized need to remain home until released by physician or Infectious Disease Clinic. Referred to [facility] office to confirm absencel; need  physician statement of hospital admission and discharge from care for DSS program reimbursement. Follow-up with agency social worker, Chealy Sin. Assisted with transportation. Refer to DSS Medicaid Transportion in future. Follow-up with [facility] Infectious Disease Clinic regarding care and return to school. Jannetta Quint, RN/CN.

## 2018-04-26 LAB — ACID FAST SMEAR (AFB, MYCOBACTERIA)
Acid Fast Smear: NEGATIVE
Acid Fast Smear: NEGATIVE

## 2018-04-26 LAB — ACID FAST SMEAR (AFB)

## 2018-04-30 ENCOUNTER — Other Ambulatory Visit: Payer: Self-pay | Admitting: Family Medicine

## 2018-04-30 DIAGNOSIS — A310 Pulmonary mycobacterial infection: Secondary | ICD-10-CM

## 2018-04-30 NOTE — Progress Notes (Signed)
  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 Discussed with ID on call who recommended referral to ID clinic as outpatient. Referral made.   Matilde Haymaker MD PGY-1, [facility]

## 2018-05-09 LAB — MAC SUSCEPTIBILITY BROTH
Amikacin: 16
Ciprofloxacin: 4
Clarithromycin: 0.5
Ethambutol: 8
Streptomycin: 32

## 2018-05-09 LAB — ACID FAST CULTURE WITH REFLEXED SENSITIVITIES (MYCOBACTERIA): Acid Fast Culture: POSITIVE — AB

## 2018-05-09 LAB — AFB ORGANISM ID BY DNA PROBE
M avium complex: POSITIVE — AB
M tuberculosis complex: NEGATIVE

## 2018-05-15 LAB — FUNGUS CULTURE RESULT

## 2018-05-15 LAB — FUNGUS CULTURE WITH STAIN

## 2018-05-15 LAB — FUNGAL ORGANISM REFLEX

## 2018-05-15 NOTE — Congregational Nurse Program (Signed)
Congregational Nurse Program Note  Date of Encounter: 05/15/2018  Past Medical History: Past Medical History:  Diagnosis Date  . Asthma    "since I was born" (23-Apr-2018)  . Family history of adverse reaction to anesthesia    "sister operated on in 01-16-1971; she died; she was pregnant" (April 23, 2018)  . Hemoptysis 04/07/2018    Encounter Details: CNP Questionnaire - 05/15/18 1130      Questionnaire   Patient Status  Refugee    Race  African    Location Patient Served At  [place]  Medicaid    Uninsured  Not Applicable    Food  No food insecurities    Housing/Utilities  Yes, have permanent housing    Transportation  Yes, need transportation assistance    Interpersonal Safety  Yes, feel physically and emotionally safe where you currently live    Medication  No medication insecurities    Medical Provider  No    Referrals  Other    ED Visit Averted  Not Applicable    Life-Saving Intervention Made  Not Applicable      First office visit and encounter in several weeks after advised to remain home away from [employer] classroom until lab results cleared. [employer] used for [employer] preferred although fluent in [place].  Requested assistance scheduling return medical appointments. Very thin and neatly dressed [place] man coughing through most of interview. Afebrile. Cough wheezing and productive. Offered tissue to use during cough and hygiene measures. Urged increased fluid intake and return to review dietary needs and assistance from agency social worker. Contacted [facility] nurse regarding follow-up. Scheduled appointments with Dr. Megan Salon [facility],  May 22, 2018 at 11:15am and Dr. Lavonna Rua FP on August 30,2019 at 2:45 pm. Assistance with transportation. Return to [place] office for follow-up and prn. Jannetta Quint, RN/CN

## 2018-05-16 ENCOUNTER — Telehealth: Payer: Self-pay | Admitting: *Deleted

## 2018-05-16 NOTE — Telephone Encounter (Signed)
Todd Duncan from [facility] called on the patient as he has come through the health department and was started on treatment for latent Tb but did not complete the therapy. 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 Advised her no idea what the plan is and we have not seen him yet but will give him her information. She advised she is leaving the office today at 5 and will be in late tomorrow after 10:30 am [phone].

## 2018-05-16 NOTE — Telephone Encounter (Signed)
Please let Hoyle Sauer know that I will call her after I see Mr. Palau in clinic next Tuesday.

## 2018-05-17 NOTE — Telephone Encounter (Signed)
Called Hoyle Sauer per Dr Megan Salon message and she is fine with a call next week.

## 2018-05-21 ENCOUNTER — Inpatient Hospital Stay: Payer: Medicaid Other | Admitting: Internal Medicine

## 2018-05-22 ENCOUNTER — Inpatient Hospital Stay: Payer: Medicaid Other | Admitting: Internal Medicine

## 2018-05-23 LAB — ACID FAST CULTURE WITH REFLEXED SENSITIVITIES: ACID FAST CULTURE - AFSCU3: NEGATIVE

## 2018-05-23 LAB — ACID FAST CULTURE WITH REFLEXED SENSITIVITIES (MYCOBACTERIA): Acid Fast Culture: NEGATIVE

## 2018-05-24 ENCOUNTER — Inpatient Hospital Stay: Payer: Medicaid Other | Admitting: Internal Medicine

## 2018-05-25 ENCOUNTER — Telehealth: Payer: Self-pay

## 2018-05-25 LAB — ACID FAST CULTURE WITH REFLEXED SENSITIVITIES
ACID FAST CULTURE - AFSCU3: NEGATIVE
ACID FAST CULTURE - AFSCU3: NEGATIVE

## 2018-05-25 LAB — ACID FAST CULTURE WITH REFLEXED SENSITIVITIES (MYCOBACTERIA)

## 2018-05-25 NOTE — Telephone Encounter (Signed)
Note : Client came to the office . He had missed his appointment on 05-24-18 because of family emergency. I called Dr Hale Bogus office to reschedule. Nothing was available until September but she would call the client( I gave his number or my number to give that information. Marland Kitchen He did not come back to office but [place] the community worker  Was given the information and his papers that he left. She was going to relate the message for me to client and give him his papers back [address] Marguerite Barba RN BSN CN [facility] PhD. [phone]

## 2018-05-28 ENCOUNTER — Telehealth: Payer: Self-pay

## 2018-05-28 NOTE — Telephone Encounter (Signed)
[employer] community worker will make sure that the client and his family will get information on appointment change as stated in comments. I thank the nurse for calling back she could not get in touch with client so she called me to get the information to him. [employer] BSN CN [facility] PhD. [phone].

## 2018-05-30 NOTE — Congregational Nurse Program (Signed)
Ofice visit to follow-up appointment rescheduled for evaluation at [facility]. Appointment confirmed for 05/31/18 at 10:45 am.; [address], [address]. Concerned that medications completed and no further prescriptions available. Very thin frame man speaking with Swahili interpreter. States " I feel a little better". Afebrile. No wheezing noted. Occasional rattling cough during interview. Attending English classes daily. Transportation assistance provided to appointment. Follow-up at [place] office prn. Jannetta Quint, RN/CN. [phone].

## 2018-05-30 NOTE — Congregational Nurse Program (Unsigned)
Telephone contact with [facility] to clarify appointment date and time for client. Information to be forwarded to patient on 05/30/18. Dr. Megan Salon contacted regarding patient's concern about prescription renewal for anti-TB medications taken in past. Follow-up of patient planned with office associate regarding concerns and treatment. Jannetta Quint, RN/CN

## 2018-05-31 ENCOUNTER — Ambulatory Visit
Admission: RE | Admit: 2018-05-31 | Discharge: 2018-05-31 | Disposition: A | Discharge status: Home / Self Care | Payer: Medicaid Other | Source: Ambulatory Visit | Attending: Family | Admitting: Family

## 2018-05-31 ENCOUNTER — Ambulatory Visit (INDEPENDENT_AMBULATORY_CARE_PROVIDER_SITE_OTHER): Payer: Medicaid Other | Admitting: Family

## 2018-05-31 ENCOUNTER — Encounter: Payer: Self-pay | Admitting: Family

## 2018-05-31 VITALS — Wt 118.8 lb

## 2018-05-31 DIAGNOSIS — A310 Pulmonary mycobacterial infection: Secondary | ICD-10-CM

## 2018-05-31 DIAGNOSIS — R7611 Nonspecific reaction to tuberculin skin test without active tuberculosis: Secondary | ICD-10-CM | POA: Diagnosis not present

## 2018-05-31 DIAGNOSIS — Z227 Latent tuberculosis: Secondary | ICD-10-CM | POA: Insufficient documentation

## 2018-05-31 DIAGNOSIS — R042 Hemoptysis: Secondary | ICD-10-CM | POA: Diagnosis not present

## 2018-05-31 MED ORDER — ISONIAZID 300 MG PO TABS: 300.0000 mg | ORAL_TABLET | Freq: Every day | ORAL | 6 refills | Status: DC

## 2018-05-31 MED ORDER — VITAMIN B-6 50 MG PO TABS: 50.0000 mg | ORAL_TABLET | Freq: Every day | ORAL | 6 refills | Status: DC

## 2018-05-31 NOTE — Assessment & Plan Note (Signed)
[name] has been previously diagnosed with latent tuberculosis and has only completed 2 weeks of therapy with INH. I will check his chest x-ray today. Question if this is causing the positive AFB culture for mycobacterium. Will start him on INH with goal treatment of at least 6 months in combination with Vitamin B6. He will follow up with Dr. Megan Salon in 1 month or sooner if needed.

## 2018-05-31 NOTE — Progress Notes (Signed)
Subjective:    Patient ID: Todd Duncan, male    DOB: 05/03/1946, 72 y.o.   MRN: [id number]  Chief Complaint  Patient presents with  . Hospitalization Follow-up  . Latent Tuberculosis     HPI:  Todd Duncan is a 72 y.o. male who presents today for office visit following hospitalization for multifocal penumonia. Todd Duncan speaks primarily Alveta Heimlich and has a medical interpreter present to aide in communication.   Todd Duncan was previously evaluated by the [facility] Department earlier in the year with concern for active tuberculosis. The AFB smears and cultures collected at that time were negative for active TB and he was diagnosed with latent tuberculosis. He was initially started on INH for therapy, however did not complete the course of medication.He was admitted to the hospital with hemoptysis  In the hospital he had a sputum culture that was positive for mycobacterium. A bronchoscopy performed on 04/13/18 has been negative for AFB smear and culture to date. Cultures taken during this time were of normal respiratory flora and no fungal organisms were seen. Most recent chest x-ray completed on 04/13/18 with no acute findings and stable cystic appearing changes in in the upper lobes bilaterally with improved aeration on the right side. It was decided to watch him off antibiotics at the time. All hospital labs, records and imaging were reviewed in detail.   Since leaving the hospital Todd Duncan has continued symptom improvements and has been without fevers, chills, sweats, weight loss or hemoptysis. He does continue to have a cough which he notes has also improved. He has used an inhaler recently and asked if he needs to continue to use it.    No Known Allergies    Outpatient Medications Prior to Visit  Medication Sig Dispense Refill  . acetaminophen (TYLENOL) 325 MG tablet Take 2 tablets (650 mg total) by mouth every 6 (six) hours. (Patient not taking: Reported on 05/31/2018)     . nicotine (NICODERM CQ - DOSED IN MG/24 HR) 7 mg/24hr patch Place 1 patch (7 mg total) onto the skin daily. (Patient not taking: Reported on 05/31/2018) 28 patch 0   No facility-administered medications prior to visit.      Past Medical History:  Diagnosis Date  . Asthma    "since I was born" (05-09-2018)  . Family history of adverse reaction to anesthesia    "sister operated on in 01-01-71; she died; she was pregnant" (2018-05-09)  . Hemoptysis 04/07/2018     Past Surgical History:  Procedure Laterality Date  . NO PAST SURGERIES    . VIDEO BRONCHOSCOPY Bilateral 04/13/2018   Procedure: VIDEO BRONCHOSCOPY WITHOUT FLUORO;  Surgeon: Juanito Doom, MD;  Location: [facility];  Service: Cardiopulmonary;  Laterality: Bilateral;    Review of Systems  Constitutional: Negative for chills, diaphoresis, fatigue, fever and unexpected weight change.  Respiratory: Positive for cough. Negative for chest tightness, shortness of breath and wheezing.   Cardiovascular: Negative for chest pain and leg swelling.      Objective:    Wt 118 lb 12.8 oz (53.9 kg)   BMI 17.84 kg/m  Nursing note and vital signs reviewed.  Physical Exam  Constitutional: He is oriented to person, place, and time. He appears well-developed and well-nourished. No distress.  Seated in the chair, appears thin; pleasant  Cardiovascular: Normal rate, regular rhythm, normal heart sounds and intact distal pulses. Exam reveals no gallop and no friction rub.  No murmur heard. Pulmonary/Chest: Effort normal and breath sounds  normal. No stridor. No respiratory distress. He has no wheezes. He has no rales. He exhibits no tenderness.  Abdominal: Soft. Bowel sounds are normal. He exhibits no distension. There is no tenderness.  Neurological: He is alert and oriented to person, place, and time.  Skin: Skin is warm and dry.  Psychiatric: He has a normal mood and affect. His behavior is normal. Judgment and thought content normal.        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 The bronchoscopy performed has cultures that remain pending for AFB. He does currently have a cough but appears to be clinically doing very well with no current indication to start him on therapy at this time. By definition he currently does not meet criteria for MAC diagnosis.  Recommend continuing to hold antibiotics at this time pending finalization of bronchoscopy cultures. He will follow up with Dr. Megan Salon in 1 month or sooner if needed.      Latent tuberculosis - Primary    Todd Duncan has been previously diagnosed with latent tuberculosis and has only completed 2 weeks of therapy with INH. I will check his chest x-ray today. Question if this is causing the positive AFB culture for mycobacterium. Will start him on INH with goal treatment of at least 6 months in combination with Vitamin B6. He will follow up with Dr. Megan Salon in 1 month or sooner if needed.       Relevant Medications   isoniazid (NYDRAZID) 300 MG tablet   pyridOXINE (VITAMIN B-6) 50 MG tablet   Other Relevant Orders   DG Chest 2 View       I am having Todd Duncan start on isoniazid and pyridOXINE. I am also having him maintain his acetaminophen and nicotine.   Meds ordered this encounter  Medications  . isoniazid (NYDRAZID) 300 MG tablet    Sig: Take 1 tablet (300 mg total) by mouth daily.    Dispense:  30 tablet    Refill:  6    Order Specific Question:   Supervising Provider    Answer:   Carlyle Basques [[id number]]  . pyridOXINE (VITAMIN B-6) 50 MG tablet    Sig: Take 1 tablet (50 mg total) by mouth daily.    Dispense:  30 tablet    Refill:  6    Order Specific Question:   Supervising Provider    Answer:   Carlyle Basques [[id number]]     Follow-up: Return in about 1 month (around 07/01/2018), or if symptoms worsen  or fail to improve.   [name], MSN, FNP-C Nurse Practitioner [facility] for Infectious Disease [place] Office phone: [phone] Pager: [place] number: [phone]

## 2018-05-31 NOTE — Assessment & Plan Note (Addendum)
Mr. Cundari had a positive AFB sputum culture with unidentified mycobacterium. The bronchoscopy performed has cultures that remain pending for AFB. He does currently have a cough but appears to be clinically doing very well with no current indication to start him on therapy at this time. By definition he currently does not meet criteria for MAC diagnosis.  Recommend continuing to hold antibiotics at this time pending finalization of bronchoscopy cultures. He will follow up with Dr. Megan Salon in 1 month or sooner if needed.

## 2018-05-31 NOTE — Patient Instructions (Signed)
Please start taking the Isoniazid with B6  We will check your chest x-ray today   Plan for follow up in 1 month with Dr. Megan Salon.

## 2018-06-05 ENCOUNTER — Telehealth: Payer: Self-pay

## 2018-06-05 NOTE — Telephone Encounter (Signed)
-----   Message from Golden Circle, [place] sent at 06/04/2018  1:00 PM EDT ----- Please inform Todd Duncan that his chest x-ray is improved since previous with no current evidence of infection. Will have him follow up with Dr. Megan Salon as scheduled.

## 2018-06-05 NOTE — Telephone Encounter (Signed)
Used phone interpreter to call patient regarding lab results per [name], Np. Unable to reach patient at this time nor leave voicemail.  [place]

## 2018-06-08 ENCOUNTER — Inpatient Hospital Stay: Payer: Medicaid Other | Admitting: Family Medicine

## 2018-06-12 LAB — ACID FAST CULTURE WITH REFLEXED SENSITIVITIES (MYCOBACTERIA): Acid Fast Culture: NEGATIVE

## 2018-06-12 LAB — ACID FAST CULTURE WITH REFLEXED SENSITIVITIES
ACID FAST CULTURE - AFSCU3: NEGATIVE
ACID FAST CULTURE - AFSCU3: NEGATIVE

## 2018-06-14 LAB — ACID FAST CULTURE WITH REFLEXED SENSITIVITIES: ACID FAST CULTURE - AFSCU3: NEGATIVE

## 2018-06-17 ENCOUNTER — Emergency Department (HOSPITAL_COMMUNITY)
Admission: EM | Admit: 2018-06-17 | Discharge: 2018-06-17 | Disposition: A | Discharge status: Home / Self Care | Payer: Medicaid Other | Attending: Emergency Medicine | Admitting: Emergency Medicine

## 2018-06-17 ENCOUNTER — Encounter (HOSPITAL_COMMUNITY): Payer: Self-pay | Admitting: Emergency Medicine

## 2018-06-17 ENCOUNTER — Emergency Department (HOSPITAL_COMMUNITY): Payer: Medicaid Other

## 2018-06-17 ENCOUNTER — Other Ambulatory Visit: Payer: Self-pay

## 2018-06-17 DIAGNOSIS — R0781 Pleurodynia: Secondary | ICD-10-CM | POA: Diagnosis not present

## 2018-06-17 DIAGNOSIS — S299XXA Unspecified injury of thorax, initial encounter: Secondary | ICD-10-CM | POA: Diagnosis not present

## 2018-06-17 DIAGNOSIS — R079 Chest pain, unspecified: Secondary | ICD-10-CM | POA: Diagnosis not present

## 2018-06-17 DIAGNOSIS — Z87891 Personal history of nicotine dependence: Secondary | ICD-10-CM | POA: Insufficient documentation

## 2018-06-17 DIAGNOSIS — R0789 Other chest pain: Secondary | ICD-10-CM | POA: Insufficient documentation

## 2018-06-17 DIAGNOSIS — Z79899 Other long term (current) drug therapy: Secondary | ICD-10-CM | POA: Diagnosis not present

## 2018-06-17 LAB — BASIC METABOLIC PANEL
ANION GAP: 12 (ref 5–15)
BUN: 15 mg/dL (ref 8–23)
CHLORIDE: 110 mmol/L (ref 98–111)
CO2: 21 mmol/L — AB (ref 22–32)
Calcium: 8.9 mg/dL (ref 8.9–10.3)
Creatinine, Ser: 0.78 mg/dL (ref 0.61–1.24)
GFR calc Af Amer: >60 mL/min (ref >60)
GFR calc non Af Amer: >60 mL/min (ref >60)
Glucose, Bld: 83 mg/dL (ref 70–99)
Potassium: 4 mmol/L (ref 3.5–5.1)
Sodium: 143 mmol/L (ref 135–145)

## 2018-06-17 LAB — CBC WITH DIFFERENTIAL/PLATELET
ABS IMMATURE GRANULOCYTES: 0 10*3/uL (ref 0.0–0.1)
Basophils Absolute: 0 10*3/uL (ref 0.0–0.1)
Basophils Relative: 1 %
Eosinophils Absolute: 0.1 10*3/uL (ref 0.0–0.7)
Eosinophils Relative: 2 %
HEMATOCRIT: 43.8 % (ref 39.0–52.0)
HEMOGLOBIN: 13.6 g/dL (ref 13.0–17.0)
IMMATURE GRANULOCYTES: 0 %
LYMPHS ABS: 1.1 10*3/uL (ref 0.7–4.0)
Lymphocytes Relative: 26 %
MCH: 27.7 pg (ref 26.0–34.0)
MCHC: 31.1 g/dL (ref 30.0–36.0)
MCV: 89.2 fL (ref 78.0–100.0)
MONOS PCT: 7 %
Monocytes Absolute: 0.3 10*3/uL (ref 0.1–1.0)
NEUTROS ABS: 2.8 10*3/uL (ref 1.7–7.7)
NEUTROS PCT: 64 %
Platelets: 220 10*3/uL (ref 150–400)
RBC: 4.91 MIL/uL (ref 4.22–5.81)
RDW: 13.7 % (ref 11.5–15.5)
WBC: 4.4 10*3/uL (ref 4.0–10.5)

## 2018-06-17 LAB — BRAIN NATRIURETIC PEPTIDE: B Natriuretic Peptide: 57.3 pg/mL (ref 0.0–100.0)

## 2018-06-17 MED ORDER — IOPAMIDOL (ISOVUE-300) INJECTION 61%: 75.0000 mL | Freq: Once | INTRAVENOUS | Status: DC | PRN

## 2018-06-17 MED ORDER — OXYCODONE-ACETAMINOPHEN 5-325 MG PO TABS
1.0000 | ORAL_TABLET | Freq: Once | ORAL | Status: AC
  Administered 2018-06-17: 1 via ORAL
  Filled 2018-06-17: qty 1

## 2018-06-17 MED ORDER — IOHEXOL 300 MG/ML  SOLN
75.0000 mL | Freq: Once | INTRAMUSCULAR | Status: AC | PRN
  Administered 2018-06-17: 75 mL via INTRAVENOUS

## 2018-06-17 NOTE — Discharge Instructions (Addendum)
No broken ribs shown on x-ray. CT scan of your chest also showed no new changes that would suggest an active infection.  For pain, you may use Tylenol. Warm or cold compresses to the side of your ribs may help too.  Be sure to follow-up with your infectious disease provider as you are scheduled to.
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
## 2018-06-17 NOTE — ED Notes (Signed)
Patient transported to X-ray 

## 2018-06-17 NOTE — ED Notes (Signed)
Patient returned from CT
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
## 2018-06-17 NOTE — ED Notes (Signed)
Patient verbalizes understanding of discharge instructions. Opportunity for questioning and answers were provided. Armband removed by staff, pt discharged from ED.  

## 2018-06-17 NOTE — ED Notes (Signed)
Patient speaks a dialect of Swahili that does not interpret well. Patient did not / could not answer multiple screening questions.

## 2018-07-04 ENCOUNTER — Ambulatory Visit: Payer: Medicaid Other | Admitting: Internal Medicine

## 2018-07-05 LAB — ACID FAST SMEAR (AFB, MYCOBACTERIA): Acid Fast Smear: NEGATIVE

## 2018-07-13 LAB — RAPID GROWER BROTH SUSCEP.
Ciprofloxacin: 0.5
Tigecycline: 0.12

## 2018-07-13 LAB — ACID FAST CULTURE WITH REFLEXED SENSITIVITIES: ACID FAST CULTURE - AFSCU3: POSITIVE — AB

## 2018-07-13 LAB — ACID FAST CULTURE WITH REFLEXED SENSITIVITIES (MYCOBACTERIA)

## 2018-07-13 LAB — ORG ID BY SEQUENCING RFLX AST

## 2018-07-13 LAB — AFB ORGANISM ID BY DNA PROBE
M AVIUM COMPLEX: NEGATIVE
M tuberculosis complex: NEGATIVE

## 2018-08-21 NOTE — Congregational Nurse Program (Signed)
  Dept: [facility] Nurse Program Note  Date of Encounter: 08/14/2018  Past Medical History: Past Medical History:  Diagnosis Date  . Asthma    "since I was born" (05/05/2018)  . Family history of adverse reaction to anesthesia    "sister operated on in 1970-12-12; she died; she was pregnant" (05/05/18)  . Hemoptysis 04/07/2018    Encounter Details: CNP Questionnaire - 08/14/18 1110      Questionnaire   Patient Status  Refugee    Race  African    Location Patient Served At  [place]  Medicaid    Uninsured  Not Applicable    Food  No food insecurities    Housing/Utilities  Yes, have permanent housing    Transportation  Yes, need transportation assistance    Interpersonal Safety  Yes, feel physically and emotionally safe where you currently live    Medication  No medication insecurities    Medical Provider  No    Referrals  Primary Care Provider/Clinic    ED Visit Averted  Not Applicable    Life-Saving Intervention Made  Not Applicable     Brief visit to [place] nurse office to request assistance in scheduling immunizations due. Referred and assisted with scheduling vaccines through [facility]. Agency confirmed MMR vaccine due. Appointment scheduled at [facility] on 08-14-18 at 4 pm. Transportation confirmed using bus. Return to [place] office for follow-up. Jannetta Quint, RN/CN

## 2018-11-08 ENCOUNTER — Telehealth: Payer: Self-pay

## 2018-11-08 NOTE — Telephone Encounter (Signed)
Health Department calling to see if patient has been compliant with his tuberculosis regimen.   She was advised last visit in August 2019 pt was told to return in 1 month and was a no show.  Patient was advised to continue medications and 6 refills sent to pharmacy.   Laverle Patter, RN

## 2018-11-20 ENCOUNTER — Encounter (HOSPITAL_COMMUNITY): Payer: Self-pay | Admitting: Emergency Medicine

## 2018-11-20 ENCOUNTER — Ambulatory Visit (HOSPITAL_COMMUNITY)
Admission: EM | Admit: 2018-11-20 | Discharge: 2018-11-20 | Disposition: A | Discharge status: Home / Self Care | Payer: Medicaid Other | Attending: Family Medicine | Admitting: Family Medicine

## 2018-11-20 DIAGNOSIS — R51 Headache: Secondary | ICD-10-CM | POA: Diagnosis not present

## 2018-11-20 DIAGNOSIS — R519 Headache, unspecified: Secondary | ICD-10-CM

## 2018-11-20 DIAGNOSIS — K5900 Constipation, unspecified: Secondary | ICD-10-CM

## 2018-11-20 DIAGNOSIS — J069 Acute upper respiratory infection, unspecified: Secondary | ICD-10-CM

## 2018-11-20 MED ORDER — POLYETHYLENE GLYCOL 3350 17 G PO PACK: 17.0000 g | PACK | Freq: Every day | ORAL | 0 refills | Status: DC

## 2018-11-20 MED ORDER — ACETAMINOPHEN 325 MG PO TABS: 650.0000 mg | ORAL_TABLET | Freq: Four times a day (QID) | ORAL | 0 refills | Status: DC

## 2018-11-20 NOTE — Congregational Nurse Program (Signed)
Visit to nurse office at [facility] for this non-English, [place] speaking man fluent in [place] and [place] also. Phone interpreter used. Described symptoms very well. History of hospital admission for respiratory related conditions in past.  Afebrile. Complains of severe headache and neck pain, especially at base of skull with stiffness upon neck extension and rotation to left and right. Chills without fever. Denies nausea or vomiting. Dizziness felt upon movement. Symptoms about three days. Non productive cough during interview. Provided mask during contact. Advised not to return to classroom group until evaluated. Referred to [facility] today immediately by taxi. Appointment scheduled at [facility] on December 20, 2018 at 8:50 am to establish PCP. Follow-up with NAI/Congregational Nurse office prn. Jannetta Quint, RN/CHN

## 2018-11-20 NOTE — ED Triage Notes (Signed)
Through Sw[place]ili Interpreter:   Patient presents to [facility] for assessment of 2 days of cough, congestion, body aches, fever, and headache.  Concerns for Malaria.  Pt has not been out of the [place] in the past 30 days.

## 2018-11-20 NOTE — Discharge Instructions (Signed)
Follow up with your primary care doctor or here within 48-72 hours. Please seek prompt medical care if: You have: A very bad (severe) headache that is not helped by medicine. Trouble walking or weakness in your arms and legs. Clear or bloody fluid coming from your nose or ears. Changes in your seeing (vision). Jerky movements that you cannot control (seizure). You throw up (vomit). Your symptoms get worse. You lose balance. Your speech is slurred. You pass out. You are sleepier and have trouble staying awake. The black centers of your eyes (pupils) change in size.  These symptoms may be an emergency. Do not wait to see if the symptoms will go away. Get medical help right away. Call your local emergency services. Do not drive yourself to the hospital.

## 2018-11-21 NOTE — ED Provider Notes (Signed)
[facility]   [id number] 11/20/18 Arrival Time: 1228-02-01  ASSESSMENT & PLAN:  1. Nonintractable headache, unspecified chronicity pattern, unspecified headache type   2. Viral upper respiratory tract infection   3. Constipation, unspecified constipation type    See AVS for discharge instructions/precautions.  Meds ordered this encounter  Medications  . acetaminophen (TYLENOL) 325 MG tablet    Sig: Take 2 tablets (650 mg total) by mouth every 6 (six) hours.    Dispense:  30 tablet    Refill:  0  . polyethylene glycol (MIRALAX / GLYCOLAX) packet    Sig: Take 17 g by mouth daily.    Dispense:  14 each    Refill:  0   Normal neurological exam. Normal/benign abdominal exam. No need for urgent imaging at this time. Discussed.  Trial of above medications. OTC symptom care as needed. Ensure adequate fluid intake and rest. May f/u with PCP or here as needed.  Follow-up Information    Schedule an appointment as soon as possible for a visit  with Matilde Haymaker, MD.   Specialty:  [facility] Medicine Contact information: [place] [address] [phone]          Reviewed expectations re: course of current medical issues. Questions answered. Outlined signs and symptoms indicating need for more acute intervention. Patient verbalized understanding. After Visit Summary given.   SUBJECTIVE: History from: patient. [name] interpreter used.  Todd Duncan is a 73 y.o. male who presents with complaint of nasal congestion, post-nasal drainage, and a persistent dry cough; without sore throat. Onset abrupt, over the past week; improving; without fatigue and without body aches. Now feeling more of a frontal headache without n/v. Ambulatory without difficulty. No extremity sensation changes or weakness. This is not the worst headache of his life. SOB: none. Wheezing: none. Fever: no. Overall normal PO intake. Known sick contacts: no. No specific or significant  aggravating or alleviating factors reported.  Also reports constipation for 3-4 days. Passing gas from rectum. No abdominal or back pain. Usually has a BM daily. No new medications or diet change reported.  OTC treatment: none reported.  Social History   Tobacco Use  Smoking Status Current Every Day Smoker  . Packs/day: 0.10  . Years: 46.00  . Pack years: 4.60  . Types: Cigarettes  Smokeless Tobacco Never Used  Tobacco Comment   10-May-2018 "stopped  in 02/2018"   ROS: As per HPI. All other systems negative.   OBJECTIVE:  Vitals:   11/20/18 1305 11/20/18 1307  BP:  (!) 128/104  Pulse:  86  Resp:  16  Temp: (!) 97.5 F (36.4 C)   TempSrc: Temporal   SpO2:  100%     General appearance: alert; no distress HEENT: mild nasal congestion; throat irritation secondary to post-nasal drainage Neck: supple without LAD CV: RRR Lungs: unlabored respirations, symmetrical air entry without wheezing; cough: absent Abd: soft; non-tender; no guarding or rebound tenderness; bowel sounds present Ext: no LE edema Skin: warm and dry Neuro: CN 2-12 grossly intact; normal gait; normal extremity strength and sensation throughout Psychological: alert and cooperative; normal mood and affect  No Known Allergies  Past Medical History:  Diagnosis Date  . Asthma    "since I was born" (May 10, 2018)  . Family history of adverse reaction to anesthesia    "sister operated on in 02/01/1971; she died; she was pregnant" (05/10/18)  . Hemoptysis 04/07/2018   Family History  Family history unknown: Yes   Social History  Socioeconomic History  . Marital status: Married    Spouse name: Not on file  . Number of children: 10  . Years of education: Not on file  . Highest education level: Not on file  Occupational History  . Not on file  Social Needs  . Financial resource strain: Not on file  . Food insecurity:    Worry: Never true    Inability: Never true  . Transportation needs:    Medical: Not on  file    Non-medical: Not on file  Tobacco Use  . Smoking status: Current Every Day Smoker    Packs/day: 0.10    Years: 46.00    Pack years: 4.60    Types: Cigarettes  . Smokeless tobacco: Never Used  . Tobacco comment: 04/11/2018 "stopped  in 02/2018"  Substance and Sexual Activity  . Alcohol use: Yes    Alcohol/week: 8.0 standard drinks    Types: 8 Cans of beer per week    Frequency: Never    Comment: 04/11/2018 "4 beers, 2 days/wk"  . Drug use: Never  . Sexual activity: Yes  Lifestyle  . Physical activity:    Days per week: Not on file    Minutes per session: Not on file  . Stress: Not on file  Relationships  . Social connections:    Talks on phone: Not on file    Gets together: Not on file    Attends religious service: Not on file    Active member of club or organization: Not on file    Attends meetings of clubs or organizations: Not on file    Relationship status: Not on file  . Intimate partner violence:    Fear of current or ex partner: Not on file    Emotionally abused: Not on file    Physically abused: Not on file    Forced sexual activity: Not on file  Other Topics Concern  . Not on file  Social History Narrative  . Not on file           Vanessa Kick, MD 11/21/18 [phone]

## 2018-12-20 ENCOUNTER — Other Ambulatory Visit: Payer: Self-pay

## 2018-12-20 ENCOUNTER — Encounter: Payer: Self-pay | Admitting: Internal Medicine

## 2018-12-20 ENCOUNTER — Ambulatory Visit: Payer: Medicaid Other | Attending: Internal Medicine | Admitting: Pulmonary Disease

## 2018-12-20 VITALS — BP 106/69 | HR 60 | Temp 98.7°F | Resp 18 | Ht 71.0 in | Wt 115.9 lb

## 2018-12-20 DIAGNOSIS — Z227 Latent tuberculosis: Secondary | ICD-10-CM

## 2018-12-20 DIAGNOSIS — R9389 Abnormal findings on diagnostic imaging of other specified body structures: Secondary | ICD-10-CM

## 2018-12-20 DIAGNOSIS — R7612 Nonspecific reaction to cell mediated immunity measurement of gamma interferon antigen response without active tuberculosis: Secondary | ICD-10-CM | POA: Diagnosis not present

## 2018-12-20 DIAGNOSIS — Z862 Personal history of diseases of the blood and blood-forming organs and certain disorders involving the immune mechanism: Secondary | ICD-10-CM

## 2018-12-20 DIAGNOSIS — Z8701 Personal history of pneumonia (recurrent): Secondary | ICD-10-CM

## 2018-12-20 DIAGNOSIS — J449 Chronic obstructive pulmonary disease, unspecified: Secondary | ICD-10-CM | POA: Diagnosis not present

## 2018-12-20 DIAGNOSIS — Z87891 Personal history of nicotine dependence: Secondary | ICD-10-CM | POA: Diagnosis not present

## 2018-12-20 DIAGNOSIS — A319 Mycobacterial infection, unspecified: Secondary | ICD-10-CM

## 2018-12-20 DIAGNOSIS — Z91199 Patient's noncompliance with other medical treatment and regimen due to unspecified reason: Secondary | ICD-10-CM

## 2018-12-20 DIAGNOSIS — Z9119 Patient's noncompliance with other medical treatment and regimen: Secondary | ICD-10-CM

## 2018-12-20 DIAGNOSIS — E440 Moderate protein-calorie malnutrition: Secondary | ICD-10-CM | POA: Diagnosis not present

## 2018-12-20 DIAGNOSIS — Z789 Other specified health status: Secondary | ICD-10-CM

## 2018-12-20 NOTE — Progress Notes (Signed)
Patient given discharge instructions.  Patient acknowledged understanding of discharge instructions.  Patient advised to stop by front desk.

## 2018-12-20 NOTE — Progress Notes (Signed)
Patient arrived ambulatory, alert and orientated to clinic.  Patient is in clinic for a hospital follow up.  Patient was seen in the ED for a headache.  Patient complaining of no headache today.

## 2018-12-27 ENCOUNTER — Encounter: Payer: Self-pay | Admitting: Pulmonary Disease

## 2018-12-27 DIAGNOSIS — Z9119 Patient's noncompliance with other medical treatment and regimen: Secondary | ICD-10-CM | POA: Insufficient documentation

## 2018-12-27 DIAGNOSIS — Z91199 Patient's noncompliance with other medical treatment and regimen due to unspecified reason: Secondary | ICD-10-CM | POA: Insufficient documentation

## 2018-12-27 DIAGNOSIS — Z8701 Personal history of pneumonia (recurrent): Secondary | ICD-10-CM | POA: Insufficient documentation

## 2018-12-27 DIAGNOSIS — A319 Mycobacterial infection, unspecified: Secondary | ICD-10-CM | POA: Insufficient documentation

## 2018-12-27 DIAGNOSIS — Z87891 Personal history of nicotine dependence: Secondary | ICD-10-CM | POA: Insufficient documentation

## 2018-12-27 DIAGNOSIS — J449 Chronic obstructive pulmonary disease, unspecified: Secondary | ICD-10-CM | POA: Insufficient documentation

## 2018-12-27 DIAGNOSIS — Z862 Personal history of diseases of the blood and blood-forming organs and certain disorders involving the immune mechanism: Secondary | ICD-10-CM | POA: Insufficient documentation

## 2018-12-27 DIAGNOSIS — R9389 Abnormal findings on diagnostic imaging of other specified body structures: Secondary | ICD-10-CM | POA: Insufficient documentation

## 2018-12-27 DIAGNOSIS — Z789 Other specified health status: Secondary | ICD-10-CM | POA: Insufficient documentation

## 2018-12-27 NOTE — Progress Notes (Signed)
New Patient Office Visit  Subjective:  Patient ID: Todd Duncan, male    DOB: 01/09/1946  Age: 73 y.o. MRN: [id number]  CC:  Chief Complaint  Patient presents with  . Hospitalization Follow-up    New pt referred by [facility] to establish PCP at [facility]...    HPI Todd Duncan presents to establish at the [facility] clinic on referral from the [facility] ER and ?[facility]...   Monica is a 73 y/o man from [place] who moved here as a refugee in late 2018.  Review of the [facility]/ Epic records is complicated by the patient's poor compliance with follow up AND the fact that he received much of his care thru the [facility] and there are no records in Epic to review from those providers.       He was initially seen by the Congregational nurse in Jan2019 c/o cough & noted to have a hx of asthma and smoking=> he was supposed to establish w/ [facility]    [phone] he was seen at [facility] by their NP for a positive Quant Gold test and an Abnormal CXR (10/17/17) IMPRESSION:  1) Findings highly suspicious for pulmonary tuberculosis in bilateral upper lobes, with unknown acuity.  2) Hyperinflation and emphysematous changes with suspected large bulla in the periphery of both lungs.  They ordered 3 sput specimens to be collected for AFB testing but they were never collected?  Later notes alluded to [place] rx per [facility]?    [id number] he went to the ER c/o cough and an asthma exac, treated w/ Pred Dosepak & asked to f/u w/ his PCP...    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    [id number] he went to the ER c/o shoulder & rib pain, they noted he had a mild cough, no sput, vital signs were ok & O2sat 98% on RA.  CXR appeared unchanged from the 10/2017 film.    [id number] he went to the ER w/ ?epistaxis and found to have a multilobar pneumonia &  was ADM 6/29 - 04/13/18 by [facility]>  ID was consulted (DrVanDam & DrCampbell)- sputum smears were neg for AFB but one culture was pos for MAI (sens Clarithromycin).  Later another sputum was reported pos for Mycobacterium Mucogenicum (multi-sensitive organism).  They consulted Pulm & has a Bronchoscopy w/ lavage on 04/13/18 by [facility] showing normal throat flora but was NEG for AFB/ Fungi- his bleeding was felt to be most likely due to bronchiectasis... he never followed-up at the [facility] clinic.    [id number] he was supposed to f/u w/ DrCampbell but missed an appt & finally saw the NP at [facility] 05/31/18=> CXR showed- IMPRESSION:  1) Resolved right basilar airspace disease since the most recent examination.  2) Findings consistent with history of tuberculosis.  He was started on INH and pyridoxine, ?followed up at the [facility]?  He was sched to f/u w/ DrCampbell but was never seen...    [id number] he went to the ER c/o left rib pain after some trauma & found to have a rib fx- CT Chest 06/17/18 showed- IMPRESSION:  1) Stable irregular scarring in the left lung.  2) Interval minimally displaced left posterior 10th rib fractures.  3) Extensive bilateral bullous changes, remaining greatest in the left upper lobe with volume loss.  4) Emphysema.Marland KitchenMarland Kitchen  [id number]- the congregational nurse helped arrange all needed vaccinations thru the [facility] but no records are available in Epic to review...    [id number] he went to ED c/o headache & neck pain, found to have cough, congestion, body aches and diagnosed w/ a viral URI.  Given Tylenol and Miralax for constipation....  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  He does not appear to require further treatment for the latent Tb or for the 2 isolated atypical mycobacteria that were noted on 2 diff sput specimens in July, but not on the BAL fluid from the same time.  He is a smoker w/ underlying COPD and he self reports a lifelong hx asthma.  He notes min cough, scant sput, no hemoptysis, and chr stable DOE w/ activities but he is way too sedentary, not getting any regular exercise.  He denies CP, palpit, dizzy, edema.  His last CXR was 06/17/18 and CT Chest at the same time noted above.  He will need a follow up CXR and PFTs.  He knows the importance of complete smoking cessation.  I will endeavor to contact DrCampbell and the TB nurse coordinator at the [facility] (we especially need Health Dept data entered into the Epic computer system)...    Smoker w/ 50+ pack-yrs smoking hx and ?if he really quit in 2019>  He understands the importance of complete smoking cessation, and the dangers that continued smoking would incur...   Malnutrition>  He is 116# and _0  Tall for a BMI=16.  Furthermore his labs during the 03/2018 Admission showed Alb in the 2.2-2.7 range.  We discussed the need for increased caloric intake, esp proteins and fat calories.  Goal is to gain ~20 lbs over time.  He also needs an exercise program...    Hx Anemia>  He was anemic during the 03/2018 Hosp w/ Hg ~9-10 range.  Follow up labs in 9/20-19 showed improvement back to normal w/ Hg=13.6.Marland KitchenMarland Kitchen   Poor Compliance and Language Barrier>  It has been very difficult to search out all the specifics of his medical history due to the language prob and his poor compliance w/ follow up visits.  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        +QuantGold & latent TB treated in [place] prev & here at [facility]    +Atypical mycobacterial cultures w/ MAI x1 & M.mucogenicum x1 from 04/2018 sput       . "Asthma" - he says ever since he was born...      . Family history of adverse reaction  to anesthesia    "sister operated on in 24-Nov-1970; she died; she was pregnant" (04-16-18)  . Hx Hemoptysis     Past Surgical History:  Procedure Laterality Date  . NO PAST SURGERIES    . VIDEO BRONCHOSCOPY Bilateral 04/13/2018   Procedure: VIDEO BRONCHOSCOPY WITHOUT FLUORO;  Surgeon: Juanito Doom, MD;  Location: [facility];  Service: Cardiopulmonary;  Laterality: Bilateral;    Current Outpatient Medications on File Prior to Visit  Medication Sig Dispense Refill  . acetaminophen (TYLENOL) 325 MG tablet Take 2 tablets (650 mg total) by mouth every 6 (six) hours. 30 tablet 0  . 30 tablet 6  . polyethylene glycol (MIRALAX / GLYCOLAX) packet Take 17 g by mouth daily. 14 each 0  . 30 tablet 6  . 28 patch 0    Family History  Family history unknown: Yes    Social History   Socioeconomic History  . Marital status: Married    Spouse name: Not on file  . Number of children: 10  . Years of education: Not on file  . Highest education level: Not on file  Occupational History  . Not on file  Social Needs  . Financial resource strain: Not on file  . Food insecurity:    Worry: Never true    Inability: Never true  . Transportation needs:    Medical: Not on file    Non-medical: Not on file  Tobacco Use  . Smoking status: Current Every Day Smoker    Packs/day: 0.10    Years: 46.00    Pack years: 4.60    Types: Cigarettes  . Smokeless tobacco: Never Used  .  Tobacco comment: 2018-04-16 "stopped  in 02/2018"  Substance and Sexual Activity  . Alcohol use: Yes    Alcohol/week: 8.0 standard drinks    Types: 8 Cans of beer per week    Frequency: Never    Comment: 04/16/2018 "4 beers, 2 days/wk"  . Drug use: Never  . Sexual activity: Yes  Lifestyle  . Physical activity:    Days per week: Not on file    Minutes per session: Not on file  . Stress: Not on file  Relationships  . Social connections:    Talks on phone: Not on file    Gets together: Not on file    Attends religious service: Not on file    Active member of club or organization: Not on file    Attends meetings of clubs or organizations: Not on file    Relationship status: Not on file  . Intimate partner violence:    Fear of current or ex partner: Not on file    Emotionally abused: Not on file    Physically abused: Not on file    Forced sexual activity: Not on file  Other Topics Concern  . Not on file  Social History Narrative  . Not on file    Immunization History  Administered Date(s) Administered  . Hepatitis B 08/23/2014, 10/16/2014, 02/18/2015  . Influenza,inj,Quad PF,6+ Mos 07/03/2018  . Influenza-Unspecified 10/12/2017  . MMR 10/12/2017  . Td 08/23/2014, 10/16/2014, 10/12/2017  . Tdap 10/12/2017    Review of Systems       All symptoms NEG except where BOLDED >> There is a language barrier... Constitutional:  F/C/S, fatigue, anorexia, unexpected weight change. HEENT:  HA, visual changes, hearing loss, earache, nasal symptoms, sore throat, mouth sores, hoarseness. Resp:  cough, sputum, hemoptysis; SOB, tightness, wheezing. Cardio:  CP, palpit, DOE, orthopnea, edema. GI:  N/V/D/C, blood in stool; reflux, abd pain, distention, gas. GU:  dysuria, freq, urgency, hematuria, flank pain, voiding difficulty. MS:  joint pain, swelling, tenderness, decr ROM; neck pain, back pain, etc. Neuro:  HA, tremors, seizures, dizziness, syncope, weakness, numbness, gait abn. Skin:   suspicious lesions or skin rash. Heme:  adenopathy, bruising, bleeding. Psyche:  confusion, agitation, sleep disturbance, hallucinations, anxiety, depression suicidal.   Objective:   Today's Vitals: BP 106/69 (BP Location: Left Arm, Patient Position: Sitting, Cuff Size: Normal)   Pulse 60   Temp 98.7 F (37.1 C) (Oral)   Resp 18   Ht _0  (1.803 m)   Wt 115 lb 14.4 oz (52.6 kg)   SpO2 97%   BMI 16.16 kg/m   Physical Exam Vital Signs:  Reviewed...  Interpreter was present throughout today's visit... General:  WD, Tall/ Thin 73 y/o man in NAD; alert & oriented; pleasant & cooperative... HEENT:  Rooks/AT; Conjunctiva- pink, Sclera- nonicteric, EOM-wnl, PERRLA, EACs-clear, TMs-wnl; NOSE-clear; THROAT-clear & wnl.  Neck:  Supple w/ fair ROM; no JVD; normal carotid impulses w/o bruits; no thyromegaly or nodules palpated; no lymphadenopathy.  Chest:  Decr BS bilat, no wheezing, no rales, few scat rhonchi, no signs of consolidation. Heart:  Regular Rhythm; norm S1 & S2, Gr 1/6 SEM w/o rubs or gallops heard. Abdomen:  Soft & nontender- no guarding or rebound; normal bowel sounds; no organomegaly or masses palpated. Ext:  Normal ROM; without deformities, +mild arthritic changes; no varicose veins, venous insuffic, or edema;  Pulses intact w/o bruits. Neuro:  CNs II-XII intact; motor testing normal; sensory testing normal; gait normal & balance OK. Derm:  No lesions noted; no rash etc. Lymph:  No cervical, supraclavicular, axillary, or inguinal adenopathy palpated.   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  He does not appear to require further treatment for the latent Tb or for the 2 isolated atypical mycobacteria that were noted on 2 diff sput specimens in July, but not on the BAL fluid from the same time.  He is a smoker w/ underlying COPD and he self reports a lifelong hx asthma.  He notes min cough, scant sput, no hemoptysis, and chr stable DOE w/ activities but he is way too sedentary, not getting any regular exercise.  He denies CP, palpit, dizzy, edema.  His last CXR was 06/17/18 and CT Chest at the same time noted above.  He will need a follow up CXR and PFTs.  He knows the importance of complete smoking cessation.  I will endeavor to contact DrCampbell and the TB nurse coordinator at the [facility] (we especially need Health Dept data entered into the Epic computer system)...    Smoker w/ 50+ pack-yrs smoking hx and ?if he really quit in 2019>  He understands the importance of complete smoking cessation, and the dangers that continued smoking would incur...   Malnutrition>  He is 116# and _1  Tall for a BMI=16.  Furthermore his labs during the 03/2018 Admission showed Alb in the 2.2-2.7 range.  We discussed the need for increased caloric intake, esp proteins and fat calories.  Goal is to gain ~20 lbs over time.  He also needs an exercise program...    Hx Anemia>  He was anemic during the 03/2018 Hosp w/ Hg ~9-10 range.  Follow up labs in 9/20-19 showed improvement back to normal w/ Hg=13.6.Marland KitchenMarland Kitchen   Poor Compliance and Language Barrier>  It has been very difficult to search out all the specifics of his medical history due to the language prob and his poor compliance w/ follow up visits.  He came today w/ an interpreter and he is asked to bring family members with him to all future medical appts and continue to bring an interpreter fluent in his native language and [place]   Outpatient Encounter Medications as of  12/20/2018  Medication Sig   ALBUTEROL-HFA inhaler 1-2 inhalations every 4H as needed for wheezing  . acetaminophen (TYLENOL) 325 MG tablet Take 2 tablets (650 mg total) by mouth every 6 (six) hours.  . . polyethylene glycol (MIRALAX / GLYCOLAX) packet Take 17 g by mouth daily.  . .   Follow-up: Return in about 3 months (around 03/22/2019).   Teressa Lower, MD  [facility] [address] [place],  [place]  [address] Main: [phone] Fax: [phone]

## 2018-12-27 NOTE — Patient Instructions (Addendum)
It was nice meeting you today...    We will need the translator (or bilingual family members) for all communications together!  I will review your [place] computer records as soon as the system is up & running...    We will contact you if need be regarding that information...  For now I want you to concentrate on:    NUTRITION-- be sure you are eating well & getting plenty of calories/ protein/ etc...        Try to gain a few pounds...    EXERCISE-- getting some daily exercise is important for your well-being & your ability to care for yourself!    SMOKING-- be sure that you are NOT smoking especially with your underlying lung disease!       Concentrate on getting good deep breaths, and cough to expectorate any phlegm...  We will plan to follow up your LABS and CXR on return...     Let's plan a follow up visit in about 3 months, sooner if needed for any problems.Marland KitchenMarland Kitchen

## 2019-11-30 ENCOUNTER — Ambulatory Visit: Payer: Medicaid Other | Attending: Internal Medicine

## 2019-11-30 DIAGNOSIS — Z23 Encounter for immunization: Secondary | ICD-10-CM | POA: Insufficient documentation

## 2019-11-30 NOTE — Progress Notes (Signed)
   Covid-19 Vaccination Clinic  Name:  Todd Duncan    MRN: [id number] DOB: 1945-12-27  11/30/2019  [name] was observed post Covid-19 immunization for 15 minutes without incidence. He was provided with Vaccine Information Sheet and instruction to access the V-Safe system.   [name] was instructed to call 911 with any severe reactions post vaccine: Marland Kitchen Difficulty breathing  . Swelling of your face and throat  . A fast heartbeat  . A bad rash all over your body  . Dizziness and weakness    Immunizations Administered    Name Date Dose VIS Date Route   Pfizer COVID-19 Vaccine 11/30/2019 10:26 AM 0.3 mL 09/20/2019 Intramuscular   Manufacturer: [place]   Lot: [id number]   [place]: [id number]

## 2019-12-24 ENCOUNTER — Ambulatory Visit: Payer: Medicaid Other | Attending: Internal Medicine

## 2019-12-24 DIAGNOSIS — Z23 Encounter for immunization: Secondary | ICD-10-CM

## 2019-12-24 NOTE — Progress Notes (Signed)
   Covid-19 Vaccination Clinic  Name:  Todd Duncan    MRN: [id number] DOB: 12-24-1945  12/24/2019  Mr. Mustard was observed post Covid-19 immunization for 15 minutes without incident. He was provided with Vaccine Information Sheet and instruction to access the V-Safe system.   [name] was instructed to call 911 with any severe reactions post vaccine: Marland Kitchen Difficulty breathing  . Swelling of face and throat  . A fast heartbeat  . A bad rash all over body  . Dizziness and weakness   Immunizations Administered    Name Date Dose VIS Date Route   Pfizer COVID-19 Vaccine 12/24/2019  9:54 AM 0.3 mL 09/20/2019 Intramuscular   Manufacturer: [place]   Lot: [id number]   [place]: [id number]

## 2020-02-11 NOTE — Congregational Nurse Program (Signed)
  Dept: [facility] Nurse Program Note  Date of Encounter: 02/11/2020  Past Medical History: Past Medical History:  Diagnosis Date  . Asthma    "since I was born" (04/12/2018)  . Family history of adverse reaction to anesthesia    "sister operated on in January 02, 1971; she died; she was pregnant" (2018/04/12)  . Hemoptysis 04/07/2018    Encounter Details: CNP Questionnaire - 02/11/20 1406      Questionnaire   Patient Status  Refugee    Race  African    Location Patient Served At  [place] [facility]  Medicaid    Uninsured  Not Applicable    Food  No food insecurities    Housing/Utilities  Yes, have permanent housing    Transportation  Yes, need transportation assistance    Interpersonal Safety  Yes, feel physically and emotionally safe where you currently live    Medication  No medication insecurities    Medical Provider  No    Referrals  Primary Care Provider/Clinic    ED Visit Averted  Not Applicable    Life-Saving Intervention Made  Not Applicable     Client came in today c/o constipation. Last bowel movement was one week ago and it wasn't a good one. He says this is a chronic problem for him. I have made an appointment to see PCP at [facility] and wellness for May 19th at 1:30 pm. Health education provided on high fiber diet to include fruits and vegatables. Earlie Server Shakeyla Giebler RN BSN PCCN [id number]
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
## 2020-02-26 ENCOUNTER — Other Ambulatory Visit: Payer: Self-pay

## 2020-02-26 ENCOUNTER — Ambulatory Visit: Payer: Medicaid Other | Attending: Nurse Practitioner | Admitting: Nurse Practitioner
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
## 2021-05-31 ENCOUNTER — Ambulatory Visit (HOSPITAL_COMMUNITY): Payer: BC Managed Care – PPO | Attending: Physician Assistant

## 2021-05-31 ENCOUNTER — Encounter (HOSPITAL_COMMUNITY): Payer: Self-pay

## 2021-05-31 ENCOUNTER — Ambulatory Visit (HOSPITAL_COMMUNITY)
Admission: EM | Admit: 2021-05-31 | Discharge: 2021-05-31 | Disposition: A | Discharge status: Home / Self Care | Payer: BC Managed Care – PPO

## 2021-05-31 ENCOUNTER — Other Ambulatory Visit: Payer: Self-pay

## 2021-05-31 DIAGNOSIS — M79604 Pain in right leg: Secondary | ICD-10-CM

## 2021-05-31 DIAGNOSIS — M79661 Pain in right lower leg: Secondary | ICD-10-CM

## 2021-05-31 NOTE — Discharge Instructions (Addendum)
Go get ultrasound to rule out a blood clot.  Assuming that your ultrasound is negative I would recommend conservative treatment measures including compression with an Ace bandage, keeping this elevated, using heat to help with symptoms.  Can use Tylenol and over-the-counter IcyHot/liniment to help with symptoms.  If symptoms or not improving I would recommend following up with sports medicine provider.

## 2021-05-31 NOTE — ED Provider Notes (Signed)
[facility]    CSN: [id number] Arrival date & time: 05/31/21  1136      History   Chief Complaint Chief Complaint  Patient presents with   Leg Pain    HPI Todd Duncan is a 75 y.o. male.   Patient presents today accompanied by his son who provided translation after declining video interpreter.  Reports a 2-week history of right calf pain.  Denies any known injury or increase in activity prior to symptom onset.  Pain is rated 6 on a 0-10 pain scale, localized to right posterior calf, described as throbbing, no aggravating or alleviating factors identified.  Denies episodes of similar symptoms in the past.  He has not tried any over-the-counter medication.  Denies any weakness, numbness, paresthesias.  Denies history of VTE event; denies history of malignancy, recent immobilization, recent surgery, recent COVID-19 infection, exogenous hormone use.   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   Malnutrition of moderate degree 04/12/2018   Multifocal pneumonia    Tobacco abuse    Asthma 04/10/2018    Class: Chronic   COPD exacerbation ([place]) 04/10/2018   Hemoptysis 04/07/2018   Positive QuantiFERON-TB Gold test 10/31/2017    Past Surgical History:  Procedure Laterality Date   NO PAST SURGERIES     VIDEO BRONCHOSCOPY Bilateral 04/13/2018   Procedure: VIDEO BRONCHOSCOPY  WITHOUT FLUORO;  Surgeon: Juanito Doom, MD;  Location: [facility];  Service: Cardiopulmonary;  Laterality: Bilateral;       Home Medications    Prior to Admission medications   Medication Sig Start Date End Date Taking? Authorizing Provider  acetaminophen (TYLENOL) 325 MG tablet Take 2 tablets (650 mg total) by mouth every 6 (six) hours. 11/30/2018   Vanessa Kick, MD  isoniazid (NYDRAZID) 300 MG tablet Take 1 tablet (300 mg total) by mouth daily. 05/31/18   Golden Circle, FNP  nicotine (NICODERM CQ - DOSED IN MG/24 HR) 7 mg/24hr patch Place 1 patch (7 mg total) onto the skin daily. Patient not taking: Reported on 12/20/2018 04/14/18   Sherene Sires, DO  polyethylene glycol (MIRALAX / GLYCOLAX) packet Take 17 g by mouth daily. 11/30/2018   Vanessa Kick, MD  pyridOXINE (VITAMIN B-6) 50 MG tablet Take 1 tablet (50 mg total) by mouth daily. 05/31/18   Golden Circle, FNP    Family History Family History  Family history unknown: Yes    Social History Social History   Tobacco Use   Smoking status: Every Day    Packs/day: 0.10    Years: 46.00    Pack years: 4.60    Types: Cigarettes   Smokeless tobacco: Never   Tobacco comments:    2018/04/21 "stopped  in 02/2018"  Vaping Use   Vaping Use: Never used  Substance Use Topics   Alcohol use: Yes    Alcohol/week: 8.0 standard drinks  Types: 8 Cans of beer per week    Comment: 04/11/2018 "4 beers, 2 days/wk"   Drug use: Never     Allergies   Patient has no known allergies.   Review of Systems Review of Systems  Constitutional:  Positive for activity change. Negative for appetite change, fatigue and fever.  Respiratory:  Negative for cough and shortness of breath.   Cardiovascular:  Negative for chest pain, palpitations and leg swelling.  Gastrointestinal:  Negative for abdominal pain, diarrhea, nausea and vomiting.  Musculoskeletal:  Positive for myalgias. Negative for arthralgias.  Neurological:  Negative for  light-headedness and headaches.    Physical Exam Triage Vital Signs ED Triage Vitals  Enc Vitals Group     BP 05/31/21 1224 119/80     Pulse Rate 05/31/21 1224 77     Resp 05/31/21 1224 16     Temp 05/31/21 1224 97.8 F (36.6 C)     Temp Source 05/31/21 1224 Oral     SpO2 05/31/21 1224 96 %     Weight --      Height --      Head Circumference --      Peak Flow --      Pain Score 05/31/21 1222 6     Pain Loc --      Pain Edu? --      Excl. in [place]? --    No data found.  Updated Vital Signs BP 119/80 (BP Location: Right Arm)   Pulse 77   Temp 97.8 F (36.6 C) (Oral)   Resp 16   SpO2 96%   Visual Acuity Right Eye Distance:   Left Eye Distance:   Bilateral Distance:    Right Eye Near:   Left Eye Near:    Bilateral Near:     Physical Exam Vitals reviewed.  Constitutional:      General: He is awake.     Appearance: Normal appearance. He is normal weight. He is not ill-appearing.     Comments: Very pleasant male appears stated age no acute distress sitting comfortably in exam room  HENT:     Head: Normocephalic and atraumatic.     Mouth/Throat:     Pharynx: No oropharyngeal exudate, posterior oropharyngeal erythema or uvula swelling.  Cardiovascular:     Rate and Rhythm: Normal rate and regular rhythm.     Pulses:          Posterior tibial pulses are 2+ on the right side and 2+ on the left side.     Heart sounds: Normal heart sounds, S1 normal and S2 normal. No murmur heard.    Comments: Positive Homans on the right.  Both calves measure 29 cm bilaterally. Pulmonary:     Effort: Pulmonary effort is normal.     Breath sounds: Normal breath sounds. No stridor. No wheezing, rhonchi or rales.     Comments: Clear to auscultation bilaterally Musculoskeletal:     Right lower leg: Tenderness present. No deformity or bony tenderness. No edema.     Left lower leg: No edema.     Comments: Right leg: Tender to palpation over gastrocnemius.  No deformity noted.  Normal  active range of motion.  Neurological:     Mental Status: He is alert.  Psychiatric:        Behavior: Behavior is cooperative.     UC Treatments / Results  Labs (all labs ordered are listed, but only abnormal results are displayed) Labs Reviewed - No data to display  EKG   Radiology No results found.  Procedures Procedures (including critical care time)  Medications Ordered in UC Medications - No data to display  Initial Impression / Assessment and Plan / UC Course  I have reviewed the triage vital signs and the nursing notes.  Pertinent labs & imaging results that were available during my care of the patient were reviewed by me and considered in my medical decision making (see chart for details).      Stat ultrasound ordered given unilateral leg pain with positive Bevelyn Buckles' sign despite this being nonspecific.  If positive will start patient on novel anticoagulant.  He was encouraged to use over-the-counter medications including Tylenol for pain relief.  Recommended conservative treatment measures including elevation, compression with Ace bandage, heat for symptom relief.  Discussed that if ultrasound is negative and he continues to have pain it would be worthwhile to follow-up with sports medicine provider he was given contact information for local provider.  Discussed alarm symptoms that warrant emergent evaluation including shortness of breath, leg swelling, chest pain.  Strict return precautions given to which patient and son expressed understanding.  Final Clinical Impressions(s) / UC Diagnoses   Final diagnoses:  Right leg pain  Right calf pain     Discharge Instructions      Go get ultrasound to rule out a blood clot.  Assuming that your ultrasound is negative I would recommend conservative treatment measures including compression with an Ace bandage, keeping this elevated, using heat to help with symptoms.  Can use Tylenol and over-the-counter IcyHot/liniment to  help with symptoms.  If symptoms or not improving I would recommend following up with sports medicine provider.     ED Prescriptions   None    PDMP not reviewed this encounter.   Terrilee Croak, PA-C 05/31/21 1323

## 2021-05-31 NOTE — ED Triage Notes (Signed)
Pt in with c/o right lower leg soreness x 2 weeks  Pt has not had otc medication for sx  Denies any recent injury or falls   States pain is mainly when he sleeps

## 2021-05-31 NOTE — ED Notes (Signed)
Todd Duncan in vascular scheduled patient for 4:00 today at [facility].  Notified patient and family member to arrive 62 minutes early, go in main entrance.  Patient and family member state understanding.

## 2022-03-17 ENCOUNTER — Encounter (HOSPITAL_COMMUNITY): Payer: Self-pay | Admitting: Emergency Medicine

## 2022-03-17 ENCOUNTER — Emergency Department (HOSPITAL_COMMUNITY): Payer: BC Managed Care – PPO

## 2022-03-17 ENCOUNTER — Emergency Department (HOSPITAL_COMMUNITY)
Admission: EM | Admit: 2022-03-17 | Discharge: 2022-03-17 | Disposition: A | Discharge status: Home / Self Care | Payer: BC Managed Care – PPO | Attending: Student | Admitting: Student

## 2022-03-17 ENCOUNTER — Other Ambulatory Visit: Payer: Self-pay

## 2022-03-17 DIAGNOSIS — J441 Chronic obstructive pulmonary disease with (acute) exacerbation: Secondary | ICD-10-CM | POA: Diagnosis not present

## 2022-03-17 DIAGNOSIS — F17210 Nicotine dependence, cigarettes, uncomplicated: Secondary | ICD-10-CM | POA: Insufficient documentation

## 2022-03-17 DIAGNOSIS — R059 Cough, unspecified: Secondary | ICD-10-CM | POA: Diagnosis not present

## 2022-03-17 LAB — CBC WITH DIFFERENTIAL/PLATELET
Abs Immature Granulocytes: 0.03 10*3/uL (ref 0.00–0.07)
Basophils Absolute: 0 10*3/uL (ref 0.0–0.1)
Basophils Relative: 1 %
Eosinophils Absolute: 0.1 10*3/uL (ref 0.0–0.5)
Eosinophils Relative: 1 %
HCT: 47.6 % (ref 39.0–52.0)
Hemoglobin: 15.6 g/dL (ref 13.0–17.0)
Immature Granulocytes: 0 %
Lymphocytes Relative: 14 %
Lymphs Abs: 1 10*3/uL (ref 0.7–4.0)
MCH: 31.3 pg (ref 26.0–34.0)
MCHC: 32.8 g/dL (ref 30.0–36.0)
MCV: 95.6 fL (ref 80.0–100.0)
Monocytes Absolute: 0.7 10*3/uL (ref 0.1–1.0)
Monocytes Relative: 10 %
Neutro Abs: 5.4 10*3/uL (ref 1.7–7.7)
Neutrophils Relative %: 74 %
Platelets: 289 10*3/uL (ref 150–400)
RBC: 4.98 MIL/uL (ref 4.22–5.81)
RDW: 13.1 % (ref 11.5–15.5)
WBC: 7.2 10*3/uL (ref 4.0–10.5)
nRBC: 0 % (ref 0.0–0.2)

## 2022-03-17 LAB — COMPREHENSIVE METABOLIC PANEL
ALT: 12 U/L (ref 0–44)
AST: 19 U/L (ref 15–41)
Albumin: 3 g/dL — ABNORMAL LOW (ref 3.5–5.0)
Alkaline Phosphatase: 84 U/L (ref 38–126)
Anion gap: 10 (ref 5–15)
BUN: 8 mg/dL (ref 8–23)
CO2: 24 mmol/L (ref 22–32)
Calcium: 9.1 mg/dL (ref 8.9–10.3)
Chloride: 99 mmol/L (ref 98–111)
Creatinine, Ser: 0.68 mg/dL (ref 0.61–1.24)
GFR, Estimated: >60 mL/min (ref >60)
Glucose, Bld: 83 mg/dL (ref 70–99)
Potassium: 4.3 mmol/L (ref 3.5–5.1)
Sodium: 133 mmol/L — ABNORMAL LOW (ref 135–145)
Total Bilirubin: 0.6 mg/dL (ref 0.3–1.2)
Total Protein: 8.8 g/dL — ABNORMAL HIGH (ref 6.5–8.1)

## 2022-03-17 MED ORDER — ALBUTEROL SULFATE HFA 108 (90 BASE) MCG/ACT IN AERS
1.0000 | INHALATION_SPRAY | Freq: Once | RESPIRATORY_TRACT | Status: AC
  Administered 2022-03-17: 1 via RESPIRATORY_TRACT
  Filled 2022-03-17: qty 6.7

## 2022-03-17 NOTE — ED Notes (Signed)
Family of patient stated they wanted to know what exams have been resulted and leave and get the other results the next day. EDP notified and went to speak with the patient and family.

## 2022-03-17 NOTE — ED Provider Triage Note (Signed)
Emergency Medicine Provider Triage Evaluation Note  Todd Duncan , a 76 y.o. male  was evaluated in triage.  Pt complains of dry cough and rib pain x 1 week.  Review of Systems  Positive: Cough, rib pain, SHOB, unintentional weight loss Negative: Fever, night sweats  Physical Exam  BP 108/74 (BP Location: Right Arm)   Pulse 80   Temp 97.7 F (36.5 C) (Oral)   Resp 14   SpO2 95%  Gen:   Awake, no distress   Resp:  Normal effort  MSK:   Moves extremities without difficulty  Other:    Medical Decision Making  Medically screening exam initiated at 11:40 AM.  Appropriate orders placed.  Adin Laker was informed that the remainder of the evaluation will be completed by another provider, this initial triage assessment does not replace that evaluation, and the importance of remaining in the ED until their evaluation is complete.     Tacy Learn, PA-C 03/17/22 1142

## 2022-03-17 NOTE — ED Notes (Signed)
Patient verbalizes understanding of d/c instructions. Opportunities for questions and answers were provided. Pt d/c from ED and ambulated to lobby.  

## 2022-03-17 NOTE — Discharge Instructions (Signed)
You were seen in the emergency department for evaluation of a cough and pain in the chest.  While here in the emergency department, laboratory evaluation was obtained that was reassuringly normal.  However, your chest x-ray shows what appears to be bullous disease in the left upper chest that has worsened since previous and our radiologists are requesting a chest CT.  I ordered the scan, but you are requesting to leave the emergency department prior to completion of the scan.  It is my medical opinion that you should stay to have the scan done, but if you would like to leave the emergency department and have this done in the outpatient setting that is okay.  It is very important that you return to the emergency department if you have new or worsening chest pain, shortness of breath, vomiting, sweating or any other concerning symptoms.

## 2022-03-17 NOTE — ED Provider Notes (Signed)
[facility] EMERGENCY DEPARTMENT Provider Note  CSN: [id number] Arrival date & time: 03/17/22 1050  Chief Complaint(s) Cough  HPI Todd Duncan is a 76 y.o. 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  Patient states that over the last week he feels that his cough has worsened and when he coughs he feels a pain in the ribs on the right.  He states he feels slightly more short of breath.  The patient did have a prolonged ED lobby stay and on my initial evaluation, patient is alert and oriented answering questions appropriately but he presents alongside his son who states that he is a physician and is very upset that his father has been waiting a long time.   Past Medical History Past Medical History:  Diagnosis Date   Asthma    "since I was born" (20-Apr-2018)   Family history of adverse reaction to anesthesia    "sister operated on in 11/28/70; she died; she was pregnant" (2018/04/20)   Hemoptysis 04/07/2018   Patient Active Problem List   Diagnosis Date Noted   Abnormal CXR 12/27/2018   Atypical mycobacterial infection 12/27/2018   COPD mixed type ([place]) 12/27/2018   History of pneumonia 12/27/2018   Poor compliance 12/27/2018   Language barrier affecting health care 12/27/2018   Ex-smoker 12/27/2018   History of anemia 12/27/2018   Latent tuberculosis 05/31/2018   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 Authorizing Provider  acetaminophen (TYLENOL) 325 MG tablet Take 2 tablets (650 mg total) by mouth every 6 (six) hours. 11/20/18    Vanessa Kick, MD  isoniazid (NYDRAZID) 300 MG tablet Take 1 tablet (300 mg total) by mouth daily. 05/31/18   Golden Circle, FNP  nicotine (NICODERM CQ - DOSED IN MG/24 HR) 7 mg/24hr patch Place 1 patch (7 mg total) onto the skin daily. Patient not taking: Reported on 12/20/2018 04/14/18   Sherene Sires, DO  polyethylene glycol (MIRALAX / GLYCOLAX) packet Take 17 g by mouth daily. 11/20/18   Vanessa Kick, MD  pyridOXINE (VITAMIN B-6) 50 MG tablet Take 1 tablet (50 mg total) by mouth daily. 05/31/18   Golden Circle, FNP                                                                                                                                    Past Surgical History Past Surgical History:  Procedure Laterality Date   NO PAST SURGERIES     VIDEO BRONCHOSCOPY Bilateral 04/13/2018   Procedure:  VIDEO BRONCHOSCOPY WITHOUT FLUORO;  Surgeon: Juanito Doom, MD;  Location: [facility];  Service: Cardiopulmonary;  Laterality: Bilateral;   Family History Family History  Family history unknown: Yes    Social History Social History   Tobacco Use   Smoking status: Every Day    Packs/day: 0.10    Years: 46.00    Total pack years: 4.60    Types: Cigarettes   Smokeless tobacco: Never   Tobacco comments:    04/11/2018 "stopped  in 02/2018"  Vaping Use   Vaping Use: Never used  Substance Use Topics   Alcohol use: Yes    Alcohol/week: 8.0 standard drinks of alcohol    Types: 8 Cans of beer per week    Comment: 04/11/2018 "4 beers, 2 days/wk"   Drug use: Never   Allergies Patient has no known allergies.  Review of Systems Review of Systems  Respiratory:  Positive for cough.   Cardiovascular:  Positive for chest pain.    Physical Exam Vital Signs  I have reviewed the triage vital signs BP 119/85 (BP Location: Right Arm)   Pulse 70   Temp 98 F (36.7 C)   Resp 18   SpO2 98%   Physical Exam Constitutional:      General: He is not in acute distress.    Appearance: Normal  appearance.  HENT:     Head: Normocephalic and atraumatic.     Nose: No congestion or rhinorrhea.  Eyes:     General:        Right eye: No discharge.        Left eye: No discharge.     Extraocular Movements: Extraocular movements intact.     Pupils: Pupils are equal, round, and reactive to light.  Cardiovascular:     Rate and Rhythm: Normal rate and regular rhythm.     Heart sounds: No murmur heard. Pulmonary:     Effort: No respiratory distress.     Breath sounds: Wheezing present. No rales.  Abdominal:     General: There is no distension.     Tenderness: There is no abdominal tenderness.  Musculoskeletal:        General: Normal range of motion.     Cervical back: Normal range of motion.  Skin:    General: Skin is warm and dry.  Neurological:     General: No focal deficit present.     Mental Status: He is alert.     ED Results and Treatments Labs (all labs ordered are listed, but only abnormal results are displayed) Labs Reviewed  COMPREHENSIVE METABOLIC PANEL - Abnormal; Notable for the following components:      Result Value   Sodium 133 (*)    Total Protein 8.8 (*)    Albumin 3.0 (*)    All other components within normal limits  CBC WITH DIFFERENTIAL/PLATELET  Radiology DG Chest 2 View  Result Date: 03/17/2022 CLINICAL DATA:  Cough, unintentional weight loss EXAM: CHEST - 2 VIEW COMPARISON:  September 2019 FINDINGS: Bullous changes and cystic bronchiectasis in the left greater than right upper lungs. Increased opacification at the left apex probably within a bulla. Increased reticular opacities at the right costophrenic angle. Normal heart size. No pleural effusion. Decreased osseous mineralization with age-indeterminate thoracic compression deformities. IMPRESSION: Increased opacification at the left apex probably within a bulla. Increased reticular  opacities at the right costophrenic angle. These findings would be better evaluated by a chest CT. Electronically Signed   By: Macy Mis M.D.   On: 03/17/2022 12:04    Pertinent labs & imaging results that were available during my care of the patient were reviewed by me and considered in my medical decision making (see MDM for details).  Medications Ordered in ED Medications  albuterol (VENTOLIN HFA) 108 (90 Base) MCG/ACT inhaler 1 puff (1 puff Inhalation Given 03/17/22 2153)                                                                                                                                     Procedures Procedures  (including critical care time)  Medical Decision Making / ED Course   This patient presents to the ED for concern of cough, pleurisy, this involves an extensive number of treatment options, and is a complaint that carries with it a high risk of complications and morbidity.  The differential diagnosis includes pneumonia, PE, COPD exacerbation, ACS, costochondritis  MDM: Seen the emergency room for evaluation of cough and pleurisy.  Physical exam with unilateral wheezing on the left with normal lung sounds on the right.  Laboratory evaluation unremarkable outside of a sodium of 133.  No leukocytosis.  Chest x-ray shows increased opacification in the left apex likely within the bulla as well as increased reticular opacities at the right costophrenic angle.  Radiology requesting CT in the setting of his cough and pleurisy I ordered a CT pulmonary angiogram to better evaluate patient's cough.  I also ordered a albuterol inhaler for the patient's wheezing.  Unfortunately, soon after my evaluation, the patient's son states that they are not willing to stay in the emergency department any longer and would like to be discharged.  I had a long discussion with both the patient and his son about the need to obtain this scan and likely obtain additional lab work to round out his  care and ensure that he does not have critical life-threatening pathology.  I also informed them that the patient would also benefit from an EKG.  They states that they are aware of the need and would like to leave the hospital regardless because they have been waiting too long.  The patient is hemodynamically stable on my reevaluation with no hypoxia, tachycardia or hypotension And the patient and his son left [facility].  I  gave the 2 of them strict return precautions stating that there is a chance that his condition could worsen which they voiced understanding.    Additional history obtained: -Additional history obtained from son -External records from outside source obtained and reviewed including: Chart review including previous notes, labs, imaging, consultation notes   Lab Tests: -I ordered, reviewed, and interpreted labs.   The pertinent results include:   Labs Reviewed  COMPREHENSIVE METABOLIC PANEL - Abnormal; Notable for the following components:      Result Value   Sodium 133 (*)    Total Protein 8.8 (*)    Albumin 3.0 (*)    All other components within normal limits  CBC WITH DIFFERENTIAL/PLATELET     Imaging Studies ordered: I ordered imaging studies including chest x-ray I independently visualized and interpreted imaging. I agree with the radiologist interpretation   Medicines ordered and prescription drug management: Meds ordered this encounter  Medications   albuterol (VENTOLIN HFA) 108 (90 Base) MCG/ACT inhaler 1 puff    -I have reviewed the patients home medicines and have made adjustments as needed  Critical interventions none   Cardiac Monitoring: The patient was maintained on a cardiac monitor.  I personally viewed and interpreted the cardiac monitored which showed an underlying rhythm of: NSR  Social Determinants of Health:  Factors impacting patients care include: Swahili speaking   Reevaluation: After the interventions noted above, I  reevaluated the patient and found that they have :stayed the same  Co morbidities that complicate the patient evaluation  Past Medical History:  Diagnosis Date   Asthma    "since I was born" (04/24/2018)   Family history of adverse reaction to anesthesia    "sister operated on in Dec 02, 1970; she died; she was pregnant" (04-24-2018)   Hemoptysis 04/07/2018      Dispostion: I considered admission for this patient, but the patient left [facility] prior to completion of his evaluation in the emergency department     Final Clinical Impression(s) / ED Diagnoses Final diagnoses:  Cough, unspecified type     _0 @    Teressa Lower, MD 03/17/22 2326-12-02

## 2022-03-17 NOTE — ED Triage Notes (Signed)
Pt complains of coughing and right flank sided rib pain. Pt denies coughing anything up. Denies n/v/d. Pt has felt sob. Denies CP.

## 2022-03-17 NOTE — ED Notes (Signed)
Per MD Kommor, pt refusing to stay and get CTA. Pt requesting to leave AMA. Discharge paperwork given, and education is given about risks of leaving without CTA, pt and family still requesting to leave.

## 2022-12-02 ENCOUNTER — Encounter (HOSPITAL_COMMUNITY): Payer: Self-pay

## 2022-12-02 ENCOUNTER — Ambulatory Visit (HOSPITAL_COMMUNITY)
Admission: RE | Admit: 2022-12-02 | Discharge: 2022-12-02 | Disposition: A | Discharge status: Home / Self Care | Payer: 59 | Source: Ambulatory Visit | Attending: Physician Assistant | Admitting: Physician Assistant

## 2022-12-02 VITALS — BP 112/73 | HR 73 | Temp 97.6°F | Resp 16 | Ht 69.0 in | Wt 116.0 lb

## 2022-12-02 DIAGNOSIS — L309 Dermatitis, unspecified: Secondary | ICD-10-CM | POA: Diagnosis not present

## 2022-12-02 DIAGNOSIS — R21 Rash and other nonspecific skin eruption: Secondary | ICD-10-CM

## 2022-12-02 MED ORDER — TRIAMCINOLONE ACETONIDE 0.1 % EX OINT: TOPICAL_OINTMENT | Freq: Two times a day (BID) | CUTANEOUS | 0 refills | Status: DC | PRN

## 2022-12-02 NOTE — Discharge Instructions (Addendum)
Tumia sabuni za hypoallergenic na sabuni. Ninapendekeza ubadilishe kuwa sabuni ya Njiwa. Omba marashi kwenye maeneo maalum ambayo yanasumbua mara mbili kwa siku. Weka maeneo haya safi kwa sabuni na maji kabla ya kutumia dawa. [employer] nyekundu, uvimbe, mifereji ya [place], upele [place], [place], [place], [place]. Todd Duncan dalili zako haziboresha au haziboresha ufuatiliaji wa haraka na dermatology; piga simu kupanga miadi.

## 2022-12-02 NOTE — ED Provider Notes (Signed)
[facility]    CSN: [id number] Arrival date & time: 12/02/22  [id number]      History   Chief Complaint Chief Complaint  Patient presents with   Rash   Appointment    HPI [name] is a 77 y.o. male.   Patient presents today with a 78-monthhistory of widespread pruritic rash.  He is accompanied by an in person Swahili interpreter who provided interpretation during visit ([place]).  Reports that rash is pruritic but not painful.  It involves his scalp and extremities.  It has been spreading since symptom onset.  He has not tried any over-the-counter medication for symptom management.  Denies any changes to his soap; uses over-the-counter Ivory soap.  Denies any changes to personal hygiene products including soap, detergents, medications.  Denies history of dermatological condition including eczema or psoriasis.  Denies any household contacts with similar symptoms.  Denies exposure to plants, insects, animals.  Denies any associated symptoms including fever, nausea, vomiting, diarrhea, weakness.    Past Medical History:  Diagnosis Date   Asthma    "since I was born" (7Jul 14, 2019   Family history of adverse reaction to anesthesia    "sister operated on in 122-Mar-1972 she died; she was pregnant" (707/14/2019   Hemoptysis 04/07/2018    Patient Active Problem List   Diagnosis Date Noted   Abnormal CXR 12/27/2018   Atypical mycobacterial infection 12/27/2018   [place]D [place] type ([place] 12/27/2018   History of pneumonia 12/27/2018   Poor compliance 12/27/2018   Language barrier affecting health care 12/27/2018   Ex-smoker 12/27/2018   History of anemia 12/27/2018   Latent tuberculosis 05/31/2018   Mycobacterium avium complex [place] 04/30/2018   Malnutrition of moderate degree 04/12/2018   Multifocal pneumonia    Tobacco abuse    Asthma 04/10/2018    Class: Chronic   COPD exacerbation ([place] 04/10/2018   Hemoptysis 04/07/2018   Positive QuantiFERON-TB Gold test 10/31/2017     Past Surgical History:  Procedure Laterality Date   NO PAST SURGERIES     VIDEO BRONCHOSCOPY Bilateral 04/13/2018   Procedure: VIDEO BRONCHOSCOPY WITHOUT FLUORO;  Surgeon: MJuanito Doom MD;  [facility]  Service: Cardiopulmonary;  Laterality: Bilateral;       Home Medications    Prior to Admission medications   Medication Sig Start Date End Date Taking? Authorizing Provider  triamcinolone 0.1% oint-Eucerin equivalent cream 1:1 mixture Apply topically 2 (two) times daily as needed. 12/02/22  Yes [name], EDerry Skill PA-C    Family History Family History  Family history unknown: Yes    Social History Social History   Tobacco Use   Smoking status: Every Day    Packs/day: 0.10    Years: 46.00    Total pack years: 4.60    Types: Cigarettes   Smokeless tobacco: Never   Tobacco comments:    707-14-19"stopped  in 02/2018"  Vaping Use   Vaping Use: Never used  Substance Use Topics   Alcohol use: Yes    Alcohol/week: 8.0 standard drinks of alcohol    Types: 8 Cans of beer per week    Comment: 714-Jul-2019"4 beers, 2 days/wk"   Drug use: Never     Allergies   Patient has no known allergies.   Review of Systems Review of Systems  Constitutional:  Negative for activity change, appetite change, chills, fatigue and fever.  HENT:  Negative for sore throat, trouble swallowing and voice change.   Respiratory:  Negative for shortness of  breath.   Cardiovascular:  Negative for chest pain.  Gastrointestinal:  Negative for abdominal pain, diarrhea, nausea and vomiting.  Skin:  Positive for rash.     Physical Exam Triage Vital Signs ED Triage Vitals  Enc Vitals Group     BP 12/02/22 0927 112/73     Pulse Rate 12/02/22 0927 73     Resp 12/02/22 0927 16     Temp 12/02/22 0927 97.6 F (36.4 C)     Temp Source 12/02/22 0927 Oral     SpO2 12/02/22 0927 93 %     Weight 12/02/22 0926 115 lb 15.4 oz (52.6 kg)     Height 12/02/22 0926 '5\' 9"'$  (1.753 m)     Head  Circumference --      Peak Flow --      Pain Score 12/02/22 0924 0     Pain Loc --      Pain Edu? --      Excl. in [place]? --    No data found.  Updated Vital Signs BP 112/73 (BP Location: Left Arm)   Pulse 73   Temp 97.6 F (36.4 C) (Oral)   Resp 16   Ht '5\' 9"'$  (1.753 m)   Wt 115 lb 15.4 oz (52.6 kg)   SpO2 93%   BMI 17.12 kg/m   Visual Acuity Right Eye Distance:   Left Eye Distance:   Bilateral Distance:    Right Eye Near:   Left Eye Near:    Bilateral Near:     Physical Exam Vitals reviewed.  Constitutional:      General: He is awake.     Appearance: Normal appearance. He is well-developed. He is not ill-appearing.     Comments: Very pleasant male appears stated age in no acute distress sitting comfortably in exam room  HENT:     Head: Normocephalic and atraumatic.     Right Ear: Tympanic membrane, ear canal and external ear normal. Tympanic membrane is not erythematous or bulging.     Left Ear: Tympanic membrane, ear canal and external ear normal. Tympanic membrane is not erythematous or bulging.     Nose: Nose normal.     Mouth/Throat:     Pharynx: Uvula midline. No oropharyngeal exudate or posterior oropharyngeal erythema.  Cardiovascular:     Rate and Rhythm: Normal rate and regular rhythm.     Heart sounds: Normal heart sounds, S1 normal and S2 normal. No murmur heard. Pulmonary:     Effort: Pulmonary effort is normal.     Breath sounds: Normal breath sounds. No stridor. No wheezing, rhonchi or rales.     Comments: Clear to auscultation bilaterally Skin:    Findings: Rash present. Rash is macular and papular.     Comments: Multiple discrete patches of maculopapular rash with evidence of excoriation noted along hairline as well as several discrete areas on upper and lower extremities.  No bleeding or drainage noted.  No streaking or evidence of lymphangitis.  Neurological:     Mental Status: He is alert.  Psychiatric:        Behavior: Behavior is cooperative.       UC Treatments / Results  Labs (all labs ordered are listed, but only abnormal results are displayed) Labs Reviewed - No data to display  EKG   Radiology No results found.  Procedures Procedures (including critical care time)  Medications Ordered in UC Medications - No data to display  Initial Impression / Assessment and Plan / UC Course  I have reviewed the triage vital signs and the nursing notes.  Pertinent labs & imaging results that were available during my care of the patient were reviewed by me and considered in my medical decision making (see chart for details).     Patient is well-appearing, afebrile, nontoxic, nontachycardic.  No evidence of infection noted warrant initiation of antibiotics.  Will treat with Eucerin/triamcinolone combination.  Discussed that he can use this along his hairline but do not use on his face.  He is to use hypoallergenic soaps and detergents and recommended that he switch to [place].  He is to follow-up with dermatology if his symptoms or not improving quickly and was given contact information for local provider with instruction to call to schedule an appointment.  Discussed that if he has any worsening symptoms including spread of rash, fever, nausea, vomiting, headache, weakness he needs to be seen immediately.  Strict return precautions given.  Final Clinical Impressions(s) / UC Diagnoses   Final diagnoses:  Dermatitis  Rash and nonspecific skin eruption     Discharge Instructions      Tumia sabuni za hypoallergenic na sabuni. Ninapendekeza ubadilishe kuwa sabuni ya Njiwa. Omba marashi kwenye maeneo maalum ambayo yanasumbua mara mbili kwa siku. Weka maeneo haya safi kwa sabuni na maji kabla ya kutumia dawa. [employer] nyekundu, uvimbe, mifereji ya [place], upele [place], [place], [place], [place]. Macy Mis dalili zako haziboresha au haziboresha ufuatiliaji wa haraka na dermatology; piga simu kupanga  miadi.     ED Prescriptions     Medication Sig Dispense Auth. Provider   triamcinolone 0.1% oint-Eucerin equivalent cream 1:1 mixture Apply topically 2 (two) times daily as needed. 100 g Sherryann Frese K, PA-C      PDMP not reviewed this encounter.   Terrilee Croak, PA-C 12/02/22 1002

## 2022-12-02 NOTE — ED Triage Notes (Signed)
Chief Complaint: rash on the right lower leg and foot. Also on the arms and torso. States the rash is spreading. The rash itches. Bumps but no drainage.   Onset: 2 months   Prescriptions or OTC medications tried: No    Sick exposure: No  New foods, medications, or products: No  Recent Travel: No

## 2022-12-07 ENCOUNTER — Telehealth (HOSPITAL_COMMUNITY): Payer: Self-pay | Admitting: Emergency Medicine

## 2022-12-07 MED ORDER — TRIAMCINOLONE ACETONIDE 0.1 % EX OINT: TOPICAL_OINTMENT | Freq: Two times a day (BID) | CUTANEOUS | 0 refills | Status: AC | PRN

## 2022-12-07 NOTE — Telephone Encounter (Signed)
Patient requested medication be resent to a different pharmacy

## 2023-01-16 ENCOUNTER — Ambulatory Visit (INDEPENDENT_AMBULATORY_CARE_PROVIDER_SITE_OTHER): Payer: 59 | Admitting: Dermatology

## 2023-01-16 ENCOUNTER — Encounter: Payer: Self-pay | Admitting: Dermatology

## 2023-01-16 VITALS — BP 106/70

## 2023-01-16 DIAGNOSIS — B353 Tinea pedis: Secondary | ICD-10-CM | POA: Diagnosis not present

## 2023-01-16 MED ORDER — CLOTRIMAZOLE-BETAMETHASONE 1-0.05 % EX CREA: 1.0000 | TOPICAL_CREAM | Freq: Two times a day (BID) | CUTANEOUS | 1 refills | Status: DC

## 2023-01-16 MED ORDER — FLUCONAZOLE 200 MG PO TABS: 200.0000 mg | ORAL_TABLET | ORAL | 0 refills | Status: AC

## 2023-01-16 NOTE — Progress Notes (Signed)
   New Patient Visit   Subjective  Todd Duncan is a 77 y.o. male who presents for the following: Rash of hands and feet x 3 months that is itchy and painful. He was [facility] 0.1% oint mixed 1:3 with Eucerin and it helped a little bit for the 3 weeks he used it. He is using an OTC oil. No recent travel.  Accompanied by interpreter   The following portions of the chart were reviewed this encounter and updated as appropriate: medications, allergies, medical history  Review of Systems:  No other skin or systemic complaints except as noted in HPI or Assessment and Plan.  Objective  Well appearing patient in no apparent distress; mood and affect are within normal limits.   A focused examination was performed of the following areas: hands, feet   Relevant exam findings are noted in the Assessment and Plan.    Assessment & Plan    TINEA PEDIS AND MANUS Exam: Scaling and maceration web spaces and over distal and lateral soles and palms and wrist.  KOH - positive          Treatment Plan: Clotrimazole-Betamethasone bid to hands and feet x 2 months, Fluconazole 200 mg 1 po qweek x 4 weeks.  Recommend treating shoes with Zeasorb AF powder  Tinea pedis of both feet  Related Procedures POCT Skin KOH    Return in about 4 weeks (around 02/13/2023).  I, Joanie Coddington, CMA, am acting as scribe for Langston Reusing, MD .   Documentation: I have reviewed the above documentation for accuracy and completeness, and I agree with the above.  Langston Reusing, MD

## 2023-01-16 NOTE — Patient Instructions (Addendum)
Due to recent changes in healthcare laws, you may see results of your pathology and/or laboratory studies on MyChart before the doctors have had a chance to review them. We understand that in some cases there may be results that are confusing or concerning to you. Please understand that not all results are received at the same time and often the doctors may need to interpret multiple results in order to provide you with the best plan of care or course of treatment. Therefore, we ask that you please give us 2 business days to thoroughly review all your results before contacting the office for clarification. Should we see a critical lab result, you will be contacted sooner.   If You Need Anything After Your Visit  If you have any questions or concerns for your doctor, please call our main line at [phone] If no one answers, please leave a voicemail as directed and we will return your call as soon as possible. Messages left after 4 pm will be answered the following business day.   You may also send us a message via MyChart. We typically respond to MyChart messages within 1-2 business days.  For prescription refills, please ask your pharmacy to contact our office. Our fax number is [phone].  If you have an urgent issue when the clinic is closed that cannot wait until the next business day, you can page your doctor at the number below.    Please note that while we do our best to be available for urgent issues outside of office hours, we are not available 24/7.   If you have an urgent issue and are unable to reach us, you may choose to seek medical care at your doctor's office, retail clinic, urgent care center, or emergency room.  If you have a medical emergency, please immediately call 911 or go to the emergency department. In the event of inclement weather, please call our main line at [phone] for an update on the status of any delays or closures.  Dermatology Medication Tips: Please  keep the boxes that topical medications come in in order to help keep track of the instructions about where and how to use these. Pharmacies typically print the medication instructions only on the boxes and not directly on the medication tubes.   If your medication is too expensive, please contact our office at [phone] or send us a message through MyChart.   We are unable to tell what your co-pay for medications will be in advance as this is different depending on your insurance coverage. However, we may be able to find a substitute medication at lower cost or fill out paperwork to get insurance to cover a needed medication.   If a prior authorization is required to get your medication covered by your insurance company, please allow us 1-2 business days to complete this process.  Drug prices often vary depending on where the prescription is filled and some pharmacies may offer cheaper prices.  The website www.goodrx.com contains coupons for medications through different pharmacies. The prices here do not account for what the cost may be with help from insurance (it may be cheaper with your insurance), but the website can give you the price if you did not use any insurance.  - You can print the associated coupon and take it with your prescription to the pharmacy.  - You may also stop by our office during regular business hours and pick up a GoodRx coupon card.  - If you need your   prescription sent electronically to a different pharmacy, notify our office through [facility] My[facility] or by phone at [phone]     

## 2023-02-13 ENCOUNTER — Encounter: Payer: Self-pay | Admitting: Dermatology

## 2023-02-13 ENCOUNTER — Ambulatory Visit: Payer: Medicaid Other | Admitting: Dermatology

## 2023-02-13 VITALS — BP 134/85 | HR 69

## 2023-02-13 DIAGNOSIS — L308 Other specified dermatitis: Secondary | ICD-10-CM

## 2023-02-13 DIAGNOSIS — R21 Rash and other nonspecific skin eruption: Secondary | ICD-10-CM

## 2023-02-13 MED ORDER — TACROLIMUS 0.1 % EX OINT: TOPICAL_OINTMENT | CUTANEOUS | 1 refills | Status: DC

## 2023-02-13 NOTE — Patient Instructions (Addendum)
Patient Handout: Wound Care for Skin Biopsy Site  Patient Handout: Wound Care for Skin Biopsy Site  Taking Care of Your Skin Biopsy Site  Proper care of the biopsy site is essential for promoting healing and minimizing scarring. This handout provides instructions on how to care for your biopsy site to ensure optimal recovery.  1. Cleaning the Wound:  Clean the biopsy site daily with gentle soap and water. Gently pat the area dry with a clean, soft towel. Avoid harsh scrubbing or rubbing the area, as this can irritate the skin and delay healing.  2. Applying Aquaphor and Bandage:  After cleaning the wound, apply a thin layer of Aquaphor ointment to the biopsy site. Cover the area with a sterile bandage to protect it from dirt, bacteria, and friction. Change the bandage daily or as needed if it becomes soiled or wet.  3. Continued Care for One Week:  Repeat the cleaning, Aquaphor application, and bandaging process daily for one week following the biopsy procedure. Keeping the wound clean and moist during this initial healing period will help prevent infection and promote optimal healing.  4. Massaging Aquaphor into the Area:  ---After one week, discontinue the use of bandages but continue to apply Aquaphor to the biopsy site. ----Gently massage the Aquaphor into the area using circular motions. ---Massaging the skin helps to promote circulation and prevent the formation of scar tissue.   Additional Tips:  Avoid exposing the biopsy site to direct sunlight during the healing process, as this can cause hyperpigmentation or worsen scarring. If you experience any signs of infection, such as increased redness, swelling, warmth, or drainage from the wound, contact your healthcare provider immediately. Follow any additional instructions provided by your healthcare provider for caring for the biopsy site and managing any discomfort. Conclusion:  Taking proper care of your skin biopsy site  is crucial for ensuring optimal healing and minimizing scarring. By following these instructions for cleaning, applying Aquaphor, and massaging the area, you can promote a smooth and successful recovery. If you have any questions or concerns about caring for your biopsy site, don't hesitate to contact your healthcare provider for guidance.      Start Tacrolimus 0.1 % ointment twice daily to feet, hands, elbows, face. Continue to use until next appointment in 2 weeks  Psoriasis Psoriasis is a long-term (chronic) skin condition. It causes raised, red patches (plaques) that look silvery on your skin. The patches may be on all areas of your body and can be any size or shape. Symptoms can range from mild to very bad. This condition cannot be passed from one person to another (is not contagious). What are the causes? The exact cause of psoriasis is not known. It occurs because the body's defense system (immune system) attacks healthy skin. This causes the patches. Some things can make the condition worse. These are: Skin damage, such as cuts, scrapes, sunburn, and dryness. Not getting enough sunlight. Some medicines. Alcohol. Tobacco. Stress. Infections. What increases the risk? You are more likely to develop this condition if you: Have a family member with psoriasis. Are very overweight (obese). Are 96-34 years old. Take certain medicines. What are the signs or symptoms? There are different types of psoriasis. You can have more than one type. The types are: Plaque. This is the most common. Symptoms include red, raised patches with a silvery coating. These may be itchy. Your nails may be crumbly or fall off. Guttate. Symptoms include small red spots on your stomach area,  arms, and legs. These may happen after you have been sick, especially with strep throat. Inverse. Symptoms include patches in your armpits, under your breasts, private areas, or on your butt. Pustular. Symptoms include  swollen, red, pus-filled bumps that hurt on the palms of your hands or the soles of your feet. You also may feel very tired, weak, have a fever, and not be hungry. Erythrodermic. Symptoms include bright red skin that looks burned. You may have a fast heartbeat and a body temperature that is too high or too low. You may be itchy or in pain. Sebopsoriasis. Symptoms include red patches on your scalp, forehead, and face that are greasy. Psoriatic arthritis. Symptoms include swollen, painful joints along with scaly skin patches. Your nails may be crumbly or fall off. There are times when symptoms may get worse (flares). How is this treated? There is no cure for this condition, but treatment can: Help your skin heal. Help with itching. Help with irritation and swelling (inflammation). Slow the growth of new skin cells. Help your body's defense system respond better to your skin. Help with any related conditions. Psoriasis can increase your risk of other conditions, such as: Heart disease. High blood pressure. Eye problems. Depression. Treatment may include: Creams or ointments. Light therapy. This may include natural sunlight or light therapy in a doctor's office. Medicines. These can help your body better manage skin cells. They may be used with light therapy or ointments. Medicines may include pills or injections. You may also get antibiotic medicines if you have an infection. These may include: Systemic therapy medicines. These medicines: Can help your body manage how the skin cells grow. Can help with irritation and swelling. May be given as pills or injections. Biologic medicines. These medicines: Are usually given through an IV or as injections. Are helpful for people with very bad symptoms. Have a higher risk of infection. Follow these instructions at home: Skin Care Use lotion on your skin as needed. Only use lotions that your doctor has said are okay. Put cool, wet cloths (cold  compresses) on the affected areas. Do not use a hot tub or take hot showers. Use slightly warm water when taking showers and baths. Do not scratch your skin. Lifestyle Keep a healthy weight. Do not eat a lot of foods that have a lot of solid fats, added sugars, or salt in them. Eat a healthy diet that includes lots of: Vegetables. Fruit. Whole grains. Low-fat dairy products. Lean proteins. Do not smoke or use any products that contain nicotine or tobacco. If you need help quitting, ask your doctor. Lower your stress. Go out in the sun as told by your doctor. Do not get sunburned. Join a support group. General instructions  Take or use over-the-counter and prescription medicines only as told by your doctor. Track the things that cause symptoms (triggers). Try to avoid these things. Do not drink alcohol if your doctor tells you not to drink. See a counselor if you feel the support would help. Keep all follow-up visits. Your doctor will check on your condition to make sure it does not get worse or cause problems. Where to find support National Psoriasis Foundation: www.psoriasis.org Where to find more information American Academy of Dermatology: InfoExam.si Contact a doctor if: You have a fever or chills. Your signs or symptoms get worse. You have more redness or warmth in the affected areas. You have new or worse pain or stiffness in your joints. Your nails break easily or pull away  from the nail bed. You feel very sad (depressed), frustrated, or hopeless. Get help right away if: You have thoughts of hurting yourself or others. Get help right away if you feel like you may hurt yourself or others, or have thoughts about taking your own life. Go to your nearest emergency room or: Call 911. Call the National Suicide Prevention Lifeline at 904-445-3982 or 988. This is open 24 hours a day. Text the Crisis Text Line at (539) 470-0519. Summary Psoriasis is a long-term (chronic) skin  condition. There is no cure for this condition, but treatment can help. Track the things that cause symptoms. Take or use over-the-counter and prescription medicines only as told by your doctor. Keep all follow-up visits. This information is not intended to replace advice given to you by your health care provider. Make sure you discuss any questions you have with your health care provider. Document Revised: 12/01/2021 Document Reviewed: 12/01/2021 Elsevier Patient Education  2023 [employer].    Due to recent changes in healthcare laws, you may see results of your pathology and/or laboratory studies on MyChart before the doctors have had a chance to review them. We understand that in some cases there may be results that are confusing or concerning to you. Please understand that not all results are received at the same time and often the doctors may need to interpret multiple results in order to provide you with the best plan of care or course of treatment. Therefore, we ask that you please give [place] 2 business days to thoroughly review all your results before contacting the office for clarification. Should we see a critical lab result, you will be contacted sooner.   If You Need Anything After Your Visit  If you have any questions or concerns for your doctor, please call our main line at [phone] If no one answers, please leave a voicemail as directed and we will return your call as soon as possible. Messages left after 4 pm will be answered the following business day.   You may also send [place] a message via MyChart. We typically respond to MyChart messages within 1-2 business days.  For prescription refills, please ask your pharmacy to contact our office. Our fax number is [phone].  If you have an urgent issue when the clinic is closed that cannot wait until the next business day, you can page your doctor at the number below.    Please note that while we do our best to be available for  urgent issues outside of office hours, we are not available 24/7.   If you have an urgent issue and are unable to reach [place], you may choose to seek medical care at your doctor's office, retail clinic, urgent care center, or emergency room.  If you have a medical emergency, please immediately call 911 or go to the emergency department. In the event of inclement weather, please call our main line at [phone] for an update on the status of any delays or closures.  Dermatology Medication Tips: Please keep the boxes that topical medications come in in order to help keep track of the instructions about where and how to use these. Pharmacies typically print the medication instructions only on the boxes and not directly on the medication tubes.   If your medication is too expensive, please contact our office at [phone] or send [place] a message through MyChart.   We are unable to tell what your co-pay for medications will be in advance as this is different  depending on your insurance coverage. However, we may be able to find a substitute medication at lower cost or fill out paperwork to get insurance to cover a needed medication.   If a prior authorization is required to get your medication covered by your insurance company, please allow [place] 1-2 business days to complete this process.  Drug prices often vary depending on where the prescription is filled and some pharmacies may offer cheaper prices.  The website www.goodrx.com contains coupons for medications through different pharmacies. The prices here do not account for what the cost may be with help from insurance (it may be cheaper with your insurance), but the website can give you the price if you did not use any insurance.  - You can print the associated coupon and take it with your prescription to the pharmacy.  - You may also stop by our office during regular business hours and pick up a GoodRx coupon card.  - If you need your prescription  sent electronically to a different pharmacy, notify our office through [facility] or by phone at [phone]

## 2023-02-13 NOTE — Progress Notes (Unsigned)
   Follow Up Visit   Subjective  Todd Duncan is a 77 y.o. male who presents for the following: Tinea pedis and manus. 4 week recheck. Using Clotrimazole-Betamethasone bid to hands and feet until ran out 2 days ago , finished Fluconazole 200 mg last Monday. Areas are still scaly Not much better. Treatment helped a little. Still itching.     The following portions of the chart were reviewed this encounter and updated as appropriate: medications, allergies, medical history  Review of Systems:  No other skin or systemic complaints except as noted in HPI or Assessment and Plan.  Objective  Well appearing patient in no apparent distress; mood and affect are within normal limits.  A focused examination was performed of the following areas: Hands and feet  Relevant exam findings are noted in the Assessment and Plan.    Assessment & Plan       No follow-ups on file.  I, Lawson Radar, CMA, am acting as scribe for Langston Reusing, MD.   Documentation: I have reviewed the above documentation for accuracy and completeness, and I agree with the above.  Langston Reusing, MD

## 2023-02-14 ENCOUNTER — Encounter: Payer: Self-pay | Admitting: Dermatology

## 2023-02-27 ENCOUNTER — Ambulatory Visit (INDEPENDENT_AMBULATORY_CARE_PROVIDER_SITE_OTHER): Payer: Medicaid Other | Admitting: Dermatology

## 2023-02-27 ENCOUNTER — Encounter: Payer: Self-pay | Admitting: Dermatology

## 2023-02-27 VITALS — BP 128/83

## 2023-02-27 DIAGNOSIS — L309 Dermatitis, unspecified: Secondary | ICD-10-CM

## 2023-02-27 MED ORDER — PREDNISONE 10 MG PO TABS: ORAL_TABLET | ORAL | 0 refills | Status: AC

## 2023-02-27 NOTE — Progress Notes (Signed)
Pathology Reviewed: No evidence of fungal infection noted.  A diagnosis of Eczema was favored by pathologist.  Results reviewed with patient at follow up appointment / SRM visit.    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 An infiltrate composed of lymphocytes and scattered eosinophils is present around the superficial vascular plexus. Following review of the hematoxylin and eosin sections, a PAS stain was obtained to exclude a fungal infection. The PAS stain is negative for fungal organisms. The findings are most consistent with a subacute eczematous dermatitis such as contact, nummular or atopic dermatitis or an id reaction.

## 2023-02-27 NOTE — Patient Instructions (Addendum)
Risks of prednisone taper discussed including mood irritability, insomnia, weight gain, stomach ulcers, increased risk of infection, increased blood sugar (diabetes), hypertension, osteoporosis with long-term or frequent use, and rare risk of avascular necrosis of the hip.     Due to recent changes in healthcare laws, you may see results of your pathology and/or laboratory studies on MyChart before the doctors have had a chance to review them. We understand that in some cases there may be results that are confusing or concerning to you. Please understand that not all results are received at the same time and often the doctors may need to interpret multiple results in order to provide you with the best plan of care or course of treatment. Therefore, we ask that you please give [place] 2 business days to thoroughly review all your results before contacting the office for clarification. Should we see a critical lab result, you will be contacted sooner.   If You Need Anything After Your Visit  If you have any questions or concerns for your doctor, please call our main line at [phone] If no one answers, please leave a voicemail as directed and we will return your call as soon as possible. Messages left after 4 pm will be answered the following business day.   You may also send [place] a message via MyChart. We typically respond to MyChart messages within 1-2 business days.  For prescription refills, please ask your pharmacy to contact our office. Our fax number is [phone].  If you have an urgent issue when the clinic is closed that cannot wait until the next business day, you can page your doctor at the number below.    Please note that while we do our best to be available for urgent issues outside of office hours, we are not available 24/7.   If you have an urgent issue and are unable to reach [place], you may choose to seek medical care at your doctor's office, retail clinic, urgent care center, or  emergency room.  If you have a medical emergency, please immediately call 911 or go to the emergency department. In the event of inclement weather, please call our main line at [phone] for an update on the status of any delays or closures.  Dermatology Medication Tips: Please keep the boxes that topical medications come in in order to help keep track of the instructions about where and how to use these. Pharmacies typically print the medication instructions only on the boxes and not directly on the medication tubes.   If your medication is too expensive, please contact our office at [phone] or send [place] a message through MyChart.   We are unable to tell what your co-pay for medications will be in advance as this is different depending on your insurance coverage. However, we may be able to find a substitute medication at lower cost or fill out paperwork to get insurance to cover a needed medication.   If a prior authorization is required to get your medication covered by your insurance company, please allow [place] 1-2 business days to complete this process.  Drug prices often vary depending on where the prescription is filled and some pharmacies may offer cheaper prices.  The website www.goodrx.com contains coupons for medications through different pharmacies. The prices here do not account for what the cost may be with help from insurance (it may be cheaper with your insurance), but the website can give you the price if you did not use any insurance.  -  You can print the associated coupon and take it with your prescription to the pharmacy.  - You may also stop by our office during regular business hours and pick up a GoodRx coupon card.  - If you need your prescription sent electronically to a different pharmacy, notify our office through [facility] or by phone at [phone]

## 2023-02-27 NOTE — Progress Notes (Signed)
Follow-Up Visit   Subjective  Todd Duncan is a 77 y.o. male who presents for the following: Punch biopsy follow up of left foot. He is treating with Tacrolimus ointment twice daily and has improved from 9 to 5 on a scale of 1-10. He thinks  his work environment is a trigger since his hands were always in water and his feet were always wet. He was working at [employer] but has been out for 3 months because of this condition. He feels he can not go back to his same job because his condition will flare again.  The following portions of the chart were reviewed this encounter and updated as appropriate: medications, allergies, medical history  Review of Systems:  No other skin or systemic complaints except as noted in HPI or Assessment and Plan.  Objective  Well appearing patient in no apparent distress; mood and affect are within normal limits.   A focused examination was performed of the following areas: Hands and feet  Relevant exam findings are noted in the Assessment and Plan.  Pathology Report Skin , left medial plantar foot PSORIASIFORM SPONGIOTIC DERMATITIS, SEE DESCRIPTION Microscopic Description There is psoriasiform hyperplasia of the epidermis with foci of slight spongiosis and parakeratosis. An infiltrate composed of lymphocytes and scattered eosinophils is present around the superficial vascular plexus. Following review of the hematoxylin and eosin sections, a PAS stain was obtained to exclude a fungal infection. The PAS stain is negative for fungal organisms. The findings are most consistent with a subacute eczematous dermatitis such as contact, nummular or atopic dermatitis or an id reaction.   Assessment & Plan   ATOPIC DERMATITIS Exam: Bilateral feet         Chronic and persistent condition with duration or expected duration over one year. Condition is symptomatic/ bothersome to patient. Not currently at goal but improving.   Atopic dermatitis (eczema)  is a chronic, relapsing, pruritic condition that can significantly affect quality of life. It is often associated with allergic rhinitis and/or asthma and can require treatment with topical medications, phototherapy, or in severe cases biologic injectable medication (Dupixent; Adbry) or Oral JAK inhibitors.  Treatment Plan:  -Start Prednisone 10 mg 4 po qd x 4 days then 3 po qd x 4 days then 2 po qd x 4 days the 1 po qd x 4 days. Discussed risk of mood changes,increased appetite and water weight gain. -Continue Tacrolimus ointment twice daily.  We will plan to give him a work note on follow up. Recommend that he change to a dryer work environment to prevent flares of condition.  Reviewed pathology report in detail with patient.  The biopsy confirmed a diagnosis  of eczematous dermatitis (possible contact dermatitis)  Pt is better but still not fully recovered. His itch has improved greatly but is still there.  Now that psoriasis has been ruled out, we can start a course of PO prednisone to full clear his flare.   Pt did not disclose his work conditions until today.  Factoring everything together, the wet working environment contributed to the initial tinea pedis infection (which responded to the Fluconazole and Clotrimazole/Betamethasone ) and the wet working environment is what triggered the eczema flare hence disrupted the skin barrier.  I recommend that he ask to be transferred from his current wet work environment to a dryer environment since continued exposure will trigger more flares in the future.  This could put him as risk for skin erosions which would lead to infections.  Once he is fully healed I will provide him with his medical records documenting his course of treatment     Recommend gentle skin care.     Return in about 3 weeks (around 03/20/2023) for Follow up.  I, Joanie Coddington, CMA, am acting as scribe for [employer], DO .   Documentation: I have reviewed the above  documentation for accuracy and completeness, and I agree with the above.  Langston Reusing, DO

## 2023-02-27 NOTE — Progress Notes (Signed)
Results reviewed:  No evidence of fungal infection.  Diagnosis of Eczema was provided.   Results were reviewed with patient at his office visit / Suture removal.  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 An infiltrate composed of lymphocytes and scattered eosinophils is present around the superficial vascular plexus. Following review of the hematoxylin and eosin sections, a PAS stain was obtained to exclude a fungal infection. The PAS stain is negative for fungal organisms. The findings are most consistent with a subacute eczematous dermatitis such as contact, nummular or atopic dermatitis or an id reaction.

## 2023-03-01 NOTE — Congregational Nurse Program (Signed)
  Dept: [phone]   Congregational Nurse Program Note  Date of Encounter: 03/01/2023  Past Medical History: Past Medical History:  Diagnosis Date   Asthma    "since I was born" (04/28/18)   Family history of adverse reaction to anesthesia    "sister operated on in 03-22-71; she died; she was pregnant" (04-28-18)   Hemoptysis 04/07/2018    Encounter Details:  CNP Questionnaire - 03/01/23 21-Mar-1201       Questionnaire   Ask client: Do you give verbal consent for me to treat you today? Yes    Student Assistance N/A    Location Patient Served  [facility]    Visit Setting with Client Organization    Patient Status Refugee    Insurance Medicaid    Insurance/Financial Assistance Referral Medicaid    Medication Have Medication Insecurities;Provided Medication Assistance    Medical Provider Yes    Screening Referrals Made N/A    Medical Referrals [facility]    Medical Appointment Made [facility]    Recently w/o PCP, now 1st time PCP visit completed due to CNs referral or appointment made N/A    Food N/A    Transportation N/A    Housing/Utilities N/A    Interpersonal Safety N/A    Interventions Advocate/Support;Navigate Healthcare System;Case Management;Counsel;Educate    Abnormal to Normal Screening Since Last CN Visit N/A    Screenings CN Performed Blood Pressure    Sent Client to Lab for: N/A    Did client attend any of the following based off CNs referral or appointments made? N/A    ED Visit Averted Yes    Life-Saving Intervention Made N/A            Patient unable to get medication from pharmacy because it shows secondary insurance.I have called medicaid and informed them that patient does not have secondary insurance from work,he stopped working 3 months ago.  Medicaid did override to allow him to pick up medication today.  Nicole Cella Marygrace Sandoval RN BSN PCCN  [facility] [phone]-cell [phone]-office

## 2023-03-02 ENCOUNTER — Telehealth: Payer: Self-pay

## 2023-03-02 NOTE — Telephone Encounter (Signed)
I have called [facility] to reestablish care with PCP. Appointment scheduled at [facility] 05/15/23 at 1:50pm  Nicole Cella Nazar Kuan RN BSN PCCN  [facility] [phone]-cell [phone]-office

## 2023-03-08 NOTE — Congregational Nurse Program (Signed)
  Dept: [phone]   Congregational Nurse Program Note  Date of Encounter: 03/08/2023  Past Medical History: Past Medical History:  Diagnosis Date   Asthma    "since I was born" (04-25-18)   Family history of adverse reaction to anesthesia    "sister operated on in 1971-03-26; she died; she was pregnant" (2018-04-25)   Hemoptysis 04/07/2018    Encounter Details:  CNP Questionnaire - 03/08/23 1043       Questionnaire   Ask client: Do you give verbal consent for me to treat you today? Yes    Student Assistance N/A    Location Patient Served  [facility]    Visit Setting with Client Organization    Patient Status Refugee    Insurance Medicaid    Insurance/Financial Assistance Referral Medicaid    Medication Have Medication Insecurities;Provided Medication Assistance    Medical Provider Yes    Screening Referrals Made N/A    Medical Referrals [facility]    Medical Appointment [facility]    Recently w/o PCP, now 1st time PCP visit completed due to CNs referral or appointment made N/A    Food N/A    Transportation N/A    Housing/Utilities N/A    Interpersonal Safety N/A    Interventions Advocate/Support;Navigate Healthcare System;Case Management;Counsel;Educate    Abnormal to Normal Screening Since Last CN Visit N/A    Screenings CN Performed N/A    Sent Client to Lab for: N/A    Did client attend any of the following based off CNs referral or appointments made? N/A    ED Visit Averted Yes    Life-Saving Intervention Made N/A             Information regarding upcoming appointments given to patient.   Nicole Cella Dorin Stooksbury RN BSN PCCN  [facility] [phone]-cell [phone]-office

## 2023-03-20 ENCOUNTER — Encounter: Payer: Self-pay | Admitting: Dermatology

## 2023-03-20 ENCOUNTER — Ambulatory Visit (INDEPENDENT_AMBULATORY_CARE_PROVIDER_SITE_OTHER): Payer: Medicaid Other | Admitting: Dermatology

## 2023-03-20 VITALS — BP 113/71

## 2023-03-20 DIAGNOSIS — L2089 Other atopic dermatitis: Secondary | ICD-10-CM

## 2023-03-20 NOTE — Progress Notes (Signed)
   Follow-Up Visit   Subjective  Todd Duncan is a 77 y.o. male who presents for the following: Following up on Atopic Dermatitis on the hands and feet. It is much improved after treatment with, Clotrimazole/Betamethasone cream and then Tacrolimus 2 x daily. It is about a 2 on itch scale today. It was a 9 previously. He has not been back to work at [employer].   The following portions of the chart were reviewed this encounter and updated as appropriate: medications, allergies, medical history  Review of Systems:  No other skin or systemic complaints except as noted in HPI or Assessment and Plan.  Objective  Well appearing patient in no apparent distress; mood and affect are within normal limits.  A focused examination was performed of the following areas: Hands and feet        Relevant exam findings are noted in the Assessment and Plan.    Assessment & Plan   Atopic Dermatitis (Improved and Currently Stable) Exam: significant improvement in scale at hands and feet  Treatment Plan: -Currently Stable  -Continue topical tacrolinus once a day for the next 2 months and then return to office for follow up  -Explained the importance of avoiding triggers to prevent future flares  -Advised to bring medical records to the [facility] facility to talk with a English as a second language teacher regarding working in a dryer work environment. It is advised that he brings an outside interpreter to talk with Human Resources.   Return in about 2 months (around 05/20/2023) for Contact Dermatitis.  Jaclynn Guarneri, CMA, am acting as scribe for [employer], DO.   Documentation: I have reviewed the above documentation for accuracy and completeness, and I agree with the above.  Langston Reusing, DO

## 2023-03-20 NOTE — Patient Instructions (Addendum)
Treatment Plan: Continue Tacrolimus ointment once daily. Advised to bring medical records to the [facility] facility to talk with a English as a second language teacher regarding working in a dryer work environment. It is advised that he brings an outside interpreter to talk with Human Resources.       Due to recent changes in healthcare laws, you may see results of your pathology and/or laboratory studies on MyChart before the doctors have had a chance to review them. We understand that in some cases there may be results that are confusing or concerning to you. Please understand that not all results are received at the same time and often the doctors may need to interpret multiple results in order to provide you with the best plan of care or course of treatment. Therefore, we ask that you please give [place] 2 business days to thoroughly review all your results before contacting the office for clarification. Should we see a critical lab result, you will be contacted sooner.   If You Need Anything After Your Visit  If you have any questions or concerns for your doctor, please call our main line at [phone] If no one answers, please leave a voicemail as directed and we will return your call as soon as possible. Messages left after 4 pm will be answered the following business day.   You may also send [place] a message via MyChart. We typically respond to MyChart messages within 1-2 business days.  For prescription refills, please ask your pharmacy to contact our office. Our fax number is [phone].  If you have an urgent issue when the clinic is closed that cannot wait until the next business day, you can page your doctor at the number below.    Please note that while we do our best to be available for urgent issues outside of office hours, we are not available 24/7.   If you have an urgent issue and are unable to reach [place], you may choose to seek medical care at your doctor's office, retail clinic, urgent care  center, or emergency room.  If you have a medical emergency, please immediately call 911 or go to the emergency department. In the event of inclement weather, please call our main line at [phone] for an update on the status of any delays or closures.  Dermatology Medication Tips: Please keep the boxes that topical medications come in in order to help keep track of the instructions about where and how to use these. Pharmacies typically print the medication instructions only on the boxes and not directly on the medication tubes.   If your medication is too expensive, please contact our office at [phone] or send [place] a message through MyChart.   We are unable to tell what your co-pay for medications will be in advance as this is different depending on your insurance coverage. However, we may be able to find a substitute medication at lower cost or fill out paperwork to get insurance to cover a needed medication.   If a prior authorization is required to get your medication covered by your insurance company, please allow [place] 1-2 business days to complete this process.  Drug prices often vary depending on where the prescription is filled and some pharmacies may offer cheaper prices.  The website www.goodrx.com contains coupons for medications through different pharmacies. The prices here do not account for what the cost may be with help from insurance (it may be cheaper with your insurance), but the website can give you the price  if you did not use any insurance.  - You can print the associated coupon and take it with your prescription to the pharmacy.  - You may also stop by our office during regular business hours and pick up a GoodRx coupon card.  - If you need your prescription sent electronically to a different pharmacy, notify our office through [facility] or by phone at [phone]

## 2023-03-21 NOTE — Congregational Nurse Program (Signed)
  Dept: [phone]   Congregational Nurse Program Note  Date of Encounter: 03/21/2023  Past Medical History: Past Medical History:  Diagnosis Date   Asthma    "since I was born" (05/01/18)   Family history of adverse reaction to anesthesia    "sister operated on in 04/16/1971; she died; she was pregnant" (05/01/2018)   Hemoptysis 04/07/2018    Encounter Details:  CNP Questionnaire - 03/21/23 04-15-01       Questionnaire   Ask client: Do you give verbal consent for me to treat you today? Yes    Student Assistance N/A;[place] Nurse    Location Patient Served  [facility]    Visit Setting with Client Organization    Patient Status Refugee    Insurance Medicaid    Insurance/Financial Assistance Referral Medicaid    Medication Have Medication Insecurities;Provided Medication Assistance    Medical Provider Yes    Screening Referrals Made N/A    Medical Referrals [facility]    Medical Appointment [facility]    Recently w/o PCP, now 1st time PCP visit completed due to CNs referral or appointment made N/A    Food N/A    Transportation N/A    Housing/Utilities N/A    Interpersonal Safety N/A    Interventions Advocate/Support;Navigate Healthcare System;Case Management;Counsel;Educate    Abnormal to Normal Screening Since Last CN Visit N/A    Screenings CN Performed N/A    Sent Client to Lab for: N/A    Did client attend any of the following based off CNs referral or appointments made? N/A    ED Visit Averted Yes    Life-Saving Intervention Made N/A            pt came In for routine blood pressure. Blood pressure was normal. Pulse reading the first time was 47 then rechecked and monitor read 52. He is asymptomatic. Advised to discuss bradycardia with PCP.   Nicole Cella Rameen Gohlke RN BSN PCCN  [facility] [phone]-cell [phone]-office

## 2023-05-15 ENCOUNTER — Encounter (INDEPENDENT_AMBULATORY_CARE_PROVIDER_SITE_OTHER): Payer: Self-pay | Admitting: Primary Care

## 2023-05-15 ENCOUNTER — Ambulatory Visit (INDEPENDENT_AMBULATORY_CARE_PROVIDER_SITE_OTHER): Payer: Medicaid Other | Admitting: Primary Care

## 2023-05-15 VITALS — BP 107/74 | HR 97 | Resp 16 | Ht 68.5 in | Wt 116.2 lb

## 2023-05-15 DIAGNOSIS — R413 Other amnesia: Secondary | ICD-10-CM

## 2023-05-15 DIAGNOSIS — E440 Moderate protein-calorie malnutrition: Secondary | ICD-10-CM

## 2023-05-15 DIAGNOSIS — Z7689 Persons encountering health services in other specified circumstances: Secondary | ICD-10-CM

## 2023-05-15 DIAGNOSIS — Z1159 Encounter for screening for other viral diseases: Secondary | ICD-10-CM

## 2023-05-15 NOTE — Progress Notes (Signed)
New Patient Office Visit  Subjective    Patient ID: Todd Duncan, male    DOB: 08-Mar-1946  Age: 76 y.o. MRN: [id number]  CC:  Chief Complaint  Patient presents with   New Patient (Initial Visit)    HPI [name] is a 77 year old male from from [place] which is also his native language interpreter present Todd Duncan presents to establish care.  Is being followed by dermatology for atopic dermatitis.  She voices concerns with forgetting things.  What is today-response was 06-11-23 what response was Todd Duncan  and is month 06/11/2023. Todd Duncan gave 3 object in Todd Duncan and he was unable to remember. Patient has No headache, No chest pain, No abdominal pain - No Nausea, No new weakness tingling or numbness, No Cough - shortness of breath  Outpatient Encounter Medications as of 05/15/2023  Medication Sig   clotrimazole-betamethasone (LOTRISONE) cream Apply 1 Application topically 2 (two) times daily. Apply to hands and feet for 2 months (Patient not taking: Reported on 05/15/2023)   fluconazole (DIFLUCAN) 200 MG tablet Take 1 tablet (200 mg total) by mouth once a week. (Patient not taking: Reported on 05/15/2023)   tacrolimus (PROTOPIC) 0.1 % ointment Apply twice daily to hands, elbows, feet and face (Patient not taking: Reported on 05/15/2023)   triamcinolone 0.1% oint-Eucerin equivalent cream 1:1 mixture Apply topically 2 (two) times daily as needed. (Patient not taking: Reported on 05/15/2023)   No facility-administered encounter medications on file as of 05/15/2023.    Past Medical History:  Diagnosis Date   Asthma    "since I was born" (01-May-2018)   Family history of adverse reaction to anesthesia    "sister operated on in 1971/06/11; she died; she was pregnant" (May 01, 2018)   Hemoptysis 04/07/2018    Past Surgical History:  Procedure Laterality Date   NO PAST SURGERIES     VIDEO BRONCHOSCOPY Bilateral 04/13/2018   Procedure: VIDEO BRONCHOSCOPY WITHOUT FLUORO;  Surgeon: Lupita Leash, MD;  Location[facility]  ENDOSCOPY;  Service: Cardiopulmonary;  Laterality: Bilateral;    Family History  Family history unknown: Yes    Social History   Socioeconomic History   Marital status: Married    Spouse name: Not on file   Number of children: 10   Years of education: Not on file   Highest education level: Not on file  Occupational History   Not on file  Tobacco Use   Smoking status: Every Day    Current packs/day: 0.10    Average packs/day: 0.1 packs/day for 46.0 years (4.6 total pack years)    Types: Cigarettes   Smokeless tobacco: Never   Tobacco comments:    05/01/18 "stopped  in 02/2018"  Vaping Use   Vaping status: Never Used  Substance and Sexual Activity   Alcohol use: Yes    Alcohol/week: 8.0 standard drinks of alcohol    Types: 8 Cans of beer per week    Comment: 05/01/18 "4 beers, 2 days/wk"   Drug use: Never   Sexual activity: Yes  Other Topics Concern   Not on file  Social History Narrative   Not on file   Social Determinants of Health   Financial Resource Strain: Not on file  Food Insecurity: No Food Insecurity (10/31/2017)   Hunger Vital Sign    Worried About Running Out of Food in the Last Year: Never true    Ran Out of Food in the Last Year: Never true  Transportation Needs: Not on file  Physical Activity: Not on  file  Stress: Not on file  Social Connections: Not on file  Intimate Partner Violence: Not on file    ROS Comprehensive ROS Pertinent positive and negative noted in HPI       Objective    Blood Pressure 107/74   Pulse 97   Respiration 16   Height 5' 8.5" (1.74 m)   Weight 116 lb 3.2 oz (52.7 kg)   Oxygen Saturation 99%   Body Mass Index 17.41 kg/m   Physical Exam Vitals reviewed.  Constitutional:      Appearance: Normal appearance. He is normal weight.  HENT:     Head: Normocephalic.     Right Ear: Tympanic membrane normal.     Left Ear: Tympanic membrane normal.     Nose: Nose normal.  Eyes:     Extraocular Movements: Extraocular  movements intact.     Pupils: Pupils are equal, round, and reactive to light.  Cardiovascular:     Rate and Rhythm: Normal rate and regular rhythm.  Pulmonary:     Effort: Pulmonary effort is normal.     Breath sounds: Normal breath sounds.  Abdominal:     General: Bowel sounds are normal.     Palpations: Abdomen is soft.  Musculoskeletal:        General: Normal range of motion.     Cervical back: Normal range of motion.  Skin:    General: Skin is warm and dry.  Neurological:     Mental Status: He is oriented to person, place, and time.  Psychiatric:        Mood and Affect: Mood normal.        Behavior: Behavior normal.      Assessment & Plan:  [name] was seen today for new patient (initial visit).  Diagnoses and all orders for this visit:  Encounter to establish care  Memory loss Taking English class and unable to remember  Printed word search in [place]   Encounter for HCV screening test for low risk patient HCV screening  Malnutrition of moderate degree Body mass index is 17.41 kg/m.  Increase protein  Cbc, cmp, TIBC  Grayce Sessions, NP

## 2023-05-18 ENCOUNTER — Ambulatory Visit (INDEPENDENT_AMBULATORY_CARE_PROVIDER_SITE_OTHER): Payer: Medicaid Other | Admitting: Dermatology

## 2023-05-18 ENCOUNTER — Encounter: Payer: Self-pay | Admitting: Dermatology

## 2023-05-18 DIAGNOSIS — L209 Atopic dermatitis, unspecified: Secondary | ICD-10-CM | POA: Diagnosis not present

## 2023-05-18 DIAGNOSIS — R21 Rash and other nonspecific skin eruption: Secondary | ICD-10-CM

## 2023-05-18 MED ORDER — TACROLIMUS 0.1 % EX OINT: TOPICAL_OINTMENT | CUTANEOUS | 3 refills | Status: AC

## 2023-05-18 MED ORDER — DUPIXENT 300 MG/2ML ~~LOC~~ SOAJ: 600.0000 mg | Freq: Once | SUBCUTANEOUS | 0 refills | Status: AC

## 2023-05-18 MED ORDER — DUPIXENT 300 MG/2ML ~~LOC~~ SOAJ: 300.0000 mg | SUBCUTANEOUS | 3 refills | Status: DC

## 2023-05-18 NOTE — Progress Notes (Signed)
   Follow-Up Visit   Subjective  Todd Duncan is a 77 y.o. male who presents for the following: atopic dermatitis  Patient present today for follow up visit for atopic dermatitis of hands and feet. Patient was last evaluated on 03/20/23. Patient reports things are stable and still dong much better.  The patient reports that his skin condition has improved since using Tacrolimus and will finish the treatment tomorrow. He believes his skin is getting better, and his hands are clear. The patient has a history of eczema, which was confirmed by a biopsy and attributed to the wet environment at his workplace. He has been applying the cream daily as prescribed.   He is currently not working due to the unavailability of a suitable position at his workplace, which would provide a dry environment as recommended for his chronic atopic dermatitis.   The following portions of the chart were reviewed this encounter and updated as appropriate: medications, allergies, medical history  Review of Systems:  No other skin or systemic complaints except as noted in HPI or Assessment and Plan.  Objective  Well appearing patient in no apparent distress; mood and affect are within normal limits.   A focused examination was performed of the following areas: Hands and feet  Relevant exam findings are noted in the Assessment and Plan.  Exam: Scaly pink papules coalescing to plaques         Assessment & Plan   Atopic Dermatitis  Chronic condition with duration or expected duration over one year. Currently well-controlled.   Treatment plan:  - Continue using Tacrolimus cream as prescribed: Apply every morning and night for two weeks, followed by a one-week break. Repeat this cycle to prevent your skin from becoming too accustomed to the treatment.   We have refilled your Tacrolimus prescription to ensure you have a sufficient supply.  - Alternative Treatment Option:   - Discussed the possibility of  starting Dupixent injections, which are administered every two weeks. This treatment is an alternative to daily cream application and works from the inside out to keep your skin clear.     -He is considering continuing the Tacrolimus cream treatment with a two weeks on, one week off regimen or switching to Dupixent injections every two weeks. The patient has concerns about the injections, as he feels that using them might make him feel like he has a severe illness. However, Dr. Onalee Hua reassures him that the medication is safe and commonly used in infants with severe eczema.  - General Advice:   - We have provided educational materials about managing eczema and the importance of maintaining a dry work environment. Please continue to advocate for suitable working conditions with your employer.    Return in about 4 months (around 09/17/2023) for ATOPIC DERM.  Owens Shark, CMA, am acting as scribe for [employer], DO.   Documentation: I have reviewed the above documentation for accuracy and completeness, and I agree with the above.  Langston Reusing, DO

## 2023-05-18 NOTE — Patient Instructions (Signed)
Hello Mr. Lachat,  Thank you for visiting our clinic today. We appreciate your dedication to managing your health and are pleased to see the improvement in your condition.  Here is a summary of the key instructions from today's consultation for treatment of your Eczema (also called Atopic Dermatitis):  - Medication Management:   - Continue using Tacrolimus cream as prescribed: Apply every morning and night for two weeks, followed by a one-week break. Repeat this cycle to prevent your skin from becoming too accustomed to the treatment.   - We have refilled your Tacrolimus prescription to ensure you have a sufficient supply.  - Alternative Treatment Option:   - Discussed the possibility of starting Dupixent injections, which are administered every two weeks. This treatment is an alternative to daily cream application and works from the inside out to keep your skin clear.   - If you decide to proceed with Dupixent, the first injection will be administered in our clinic, followed by monthly deliveries to your home. The medication requires refrigeration.   - A prescription for Dupixent has been sent, pending insurance approval. We expect this process to take about three weeks, after which we will schedule an appointment to start the injections if approved.  - General Advice:   - We have provided educational materials about managing eczema and the importance of maintaining a dry work environment. Please continue to advocate for suitable working conditions with your employer.  - Follow-Up:   - We will contact you once the insurance approval for Dupixent is confirmed to arrange your next appointment.  Please feel free to reach out if you have any questions or concerns in the meantime.  Warm regards,  Dr. Langston Reusing, Dermatology  Due to recent changes in healthcare laws, you may see results of your pathology and/or laboratory studies on MyChart before the doctors have had a chance to review  them. We understand that in some cases there may be results that are confusing or concerning to you. Please understand that not all results are received at the same time and often the doctors may need to interpret multiple results in order to provide you with the best plan of care or course of treatment. Therefore, we ask that you please give [place] 2 business days to thoroughly review all your results before contacting the office for clarification. Should we see a critical lab result, you will be contacted sooner.   If You Need Anything After Your Visit  If you have any questions or concerns for your doctor, please call our main line at [phone] If no one answers, please leave a voicemail as directed and we will return your call as soon as possible. Messages left after 4 pm will be answered the following business day.   You may also send [place] a message via MyChart. We typically respond to MyChart messages within 1-2 business days.  For prescription refills, please ask your pharmacy to contact our office. Our fax number is [phone].  If you have an urgent issue when the clinic is closed that cannot wait until the next business day, you can page your doctor at the number below.    Please note that while we do our best to be available for urgent issues outside of office hours, we are not available 24/7.   If you have an urgent issue and are unable to reach [place], you may choose to seek medical care at your doctor's office, retail clinic, urgent care center, or emergency room.  If you have a medical emergency, please immediately call 911 or go to the emergency department. In the event of inclement weather, please call our main line at [phone] for an update on the status of any delays or closures.  Dermatology Medication Tips: Please keep the boxes that topical medications come in in order to help keep track of the instructions about where and how to use these. Pharmacies typically print the  medication instructions only on the boxes and not directly on the medication tubes.   If your medication is too expensive, please contact our office at [phone] or send [place] a message through MyChart.   We are unable to tell what your co-pay for medications will be in advance as this is different depending on your insurance coverage. However, we may be able to find a substitute medication at lower cost or fill out paperwork to get insurance to cover a needed medication.   If a prior authorization is required to get your medication covered by your insurance company, please allow [place] 1-2 business days to complete this process.  Drug prices often vary depending on where the prescription is filled and some pharmacies may offer cheaper prices.  The website www.goodrx.com contains coupons for medications through different pharmacies. The prices here do not account for what the cost may be with help from insurance (it may be cheaper with your insurance), but the website can give you the price if you did not use any insurance.  - You can print the associated coupon and take it with your prescription to the pharmacy.  - You may also stop by our office during regular business hours and pick up a GoodRx coupon card.  - If you need your prescription sent electronically to a different pharmacy, notify our office through [facility] or by phone at [phone]

## 2023-06-07 ENCOUNTER — Telehealth: Payer: Self-pay

## 2023-06-07 NOTE — Telephone Encounter (Signed)
Called pt's emergency contact to let him know to expect a call to set up delivery date/time for his dupixent and that when they receive it he should call the office to sched an appt for his first injection

## 2023-06-13 NOTE — Congregational Nurse Program (Signed)
  Dept: [phone]   Congregational Nurse Program Note  Date of Encounter: 06/13/2023  Past Medical History: Past Medical History:  Diagnosis Date   Asthma    "since I was born" (05-02-2018)   Family history of adverse reaction to anesthesia    "sister operated on in 1971-07-09; she died; she was pregnant" (05/02/2018)   Hemoptysis 04/07/2018    Encounter Details:  CNP Questionnaire - 06/13/23 08-Jul-1206       Questionnaire   Ask client: Do you give verbal consent for me to treat you today? Yes    Student Assistance [employer] Nurse    Location Patient Served  [place]    Visit Setting with Client Organization    Patient Status Refugee    Insurance Medicaid    Insurance/Financial Assistance Referral Medicaid    Medication Have Medication Insecurities;Provided Medication Assistance    Medical Provider Yes    Screening Referrals Made N/A    Medical Referrals Made N/A    Medical Appointment Made N/A    Recently w/o PCP, now 1st time PCP visit completed due to CNs referral or appointment made Yes    Food Have Food Insecurities    Transportation N/A;Provided transportation assistance    Housing/Utilities N/A    Interpersonal Safety N/A    Interventions Advocate/Support;Navigate Healthcare System;Case Management;Counsel;Educate    Abnormal to Normal Screening Since Last CN Visit N/A    Screenings CN Performed Blood Pressure    Sent Client to Lab for: N/A    Did client attend any of the following based off CNs referral or appointments made? N/A    ED Visit Averted Yes    Life-Saving Intervention Made N/A           Patient was seen today to follow up with congregational RN for previous dermatology appointment. Patient has been using clotrimazole-betamethasone cream for current skin condition but has not received injections. He is to start dupilumab injections when it is received to his house. Patient was asked to bring his injection when received so education can be provided on how to use.    Patient did state that his wife has had a recent arm injury from a motor vehicle accident which has been hard on him. Patient did say that he would bring his wife tomorrow, September 4th to follow up with congregational RN. Blood pressure was taken and education was provided.  Nicole Cella Vicent Febles RN BSN PCCN  [facility] [phone]-cell [phone]-office

## 2023-07-11 NOTE — Congregational Nurse Program (Signed)
  Dept: [phone]   Congregational Nurse Program Note  Date of Encounter: 07/11/2023  Past Medical History: Past Medical History:  Diagnosis Date   Asthma    "since I was born" (04/16/2018)   Family history of adverse reaction to anesthesia    "sister operated on in 08-03-1971; she died; she was pregnant" (2018-04-16)   Hemoptysis 04/07/2018    Encounter Details:  Patient came in regarding constipation and stated that he has not had a bowel movement in 4 days. Patient was asked about his diet and he stated that he has a good diet which is fruits, vegetables, and he is drinking enough water. Congregational nurse recommended getting prune juice or constipation over the counter medication to the patient.   Nicole Cella Naveh Rickles RN BSN PCCN  [facility] [phone]-office

## 2023-07-11 NOTE — Congregational Nurse Program (Cosign Needed)
Pt presenting with constipation. His last bowel movement was 4 days ago. No medications that could be contributing to constipation. Endorses eating high levels of fiber via fruits and vegetables and staying hydrated. Denies blood in stool. Recommended prune juice and Miralax and counseled on red flag symptoms

## 2023-08-02 ENCOUNTER — Ambulatory Visit: Payer: Medicaid Other

## 2023-09-18 ENCOUNTER — Ambulatory Visit (INDEPENDENT_AMBULATORY_CARE_PROVIDER_SITE_OTHER): Payer: Medicaid Other | Admitting: Dermatology

## 2023-09-18 ENCOUNTER — Encounter: Payer: Self-pay | Admitting: Dermatology

## 2023-09-18 DIAGNOSIS — L299 Pruritus, unspecified: Secondary | ICD-10-CM | POA: Diagnosis not present

## 2023-09-18 DIAGNOSIS — Z7189 Other specified counseling: Secondary | ICD-10-CM | POA: Diagnosis not present

## 2023-09-18 DIAGNOSIS — L309 Dermatitis, unspecified: Secondary | ICD-10-CM

## 2023-09-18 DIAGNOSIS — L209 Atopic dermatitis, unspecified: Secondary | ICD-10-CM | POA: Diagnosis not present

## 2023-09-18 MED ORDER — CLOTRIMAZOLE-BETAMETHASONE 1-0.05 % EX CREA: 1.0000 | TOPICAL_CREAM | Freq: Two times a day (BID) | CUTANEOUS | 1 refills | Status: AC

## 2023-09-18 NOTE — Patient Instructions (Addendum)
Habari Bwana Priest Shurtz,  Asante kwa kututembelea leo. Tunashukuru kujitolea kwako kusimamia afya yako. Hapa [place] muhtasari wa maagizo muhimu kutoka kwa mashauriano ya leo:  - Tacrolimus Cream: Endelea kutumia cream ya Tacrolimus kila siku kwa mikono kama ilivyoelekezwa hapo awali.  Cream ya Clotrimazole Betamethasone:  - Maombi: Anza kutumia cream ya Clotrimazole Betamethasone mara mbili kila siku, kila asubuhi na usiku kwa miguu na vifundo vyako.  - MudaBobby Rumpf wiki 2 kutokana na mwako mdogo. Baada ya kipindi hiki, rejea kwa kutumia Tacrolimus.  - Sindano za Dupixent:  - Dell Ponto wa kubadili kwa sindano za Dupixent kwa usimamizi wa muda mrefu.  - Makaratasi ya Bima: Tutaendelea na karatasi za bima ili kuchunguza chaguo hili, ambalo kwa [name] takriban wiki 3 kuthibitisha malipo ya bima.  - Hatua Zinazofuata: Baada ya idhini ya bima kupatikana, tutawasiliana nawe ili kupanga miadi ya kuanza sindano. Sindano zitatumwa nyumbani kwako, na utafundishwa jinsi ya kuzisimamia wewe [place].  Tafadhali endelea kutumia krimu kama ulivyoelekezwa hadi taarifa zaidi. Tutakujulisha kuhusu hali ya bima ya Dupixent na tutarajie kukuona tena hivi karibuni ili [place] matibabu ikiwa utachagua kuendelea na sindano.  Salamu za joto,  Dk Langston Reusing Dermatolojia    Hello [name],  Thank you for visiting [place] today. We appreciate your commitment to managing your health. Here is a summary of the key instructions from today's consultation:  - Tacrolimus Cream: Continue applying Tacrolimus cream daily to hands as previously directed.  - Clotrimazole Betamethasone Cream:   - Application: Start using Clotrimazole Betamethasone cream twice daily, every morning and night to your feet and ankles.   - Duration: Use for 2 weeks due to a mild flare. After this period, revert to using Tacrolimus.  - Dupixent Injections:   - We discussed the potential switch to Dupixent  injections for long-term management.   - Insurance Paperwork: We will proceed with insurance paperwork to explore this option, which typically takes about 3 weeks to confirm insurance coverage.   - Next Steps: Once insurance approval is obtained, we will contact you to schedule an appointment to start the injections. The injections will be mailed to your home, and you will be taught how to administer them yourself.  Please continue using the creams as directed until further notice. We will keep you updated on the status of the insurance for Dupixent and look forward to seeing you again soon to commence treatment if you choose to proceed with the injections.  Warm regards,  Dr. Langston Reusing Dermatology     Important Information  Due to recent changes in healthcare laws, you may see results of your pathology and/or laboratory studies on MyChart before the doctors have had a chance to review them. We understand that in some cases there may be results that are confusing or concerning to you. Please understand that not all results are received at the same time and often the doctors may need to interpret multiple results in order to provide you with the best plan of care or course of treatment. Therefore, we ask that you please give [place] 2 business days to thoroughly review all your results before contacting the office for clarification. Should we see a critical lab result, you will be contacted sooner.   If You Need Anything After Your Visit  If you have any questions or concerns for your doctor, please call our main line at [phone] If no one answers, please leave a voicemail as directed and we will return your call as soon  as possible. Messages left after 4 pm will be answered the following business day.   You may also send [place] a message via MyChart. We typically respond to MyChart messages within 1-2 business days.  For prescription refills, please ask your pharmacy to contact our office.  Our fax number is [phone].  If you have an urgent issue when the clinic is closed that cannot wait until the next business day, you can page your doctor at the number below.    Please note that while we do our best to be available for urgent issues outside of office hours, we are not available 24/7.   If you have an urgent issue and are unable to reach [place], you may choose to seek medical care at your doctor's office, retail clinic, urgent care center, or emergency room.  If you have a medical emergency, please immediately call 911 or go to the emergency department. In the event of inclement weather, please call our main line at [phone] for an update on the status of any delays or closures.  Dermatology Medication Tips: Please keep the boxes that topical medications come in in order to help keep track of the instructions about where and how to use these. Pharmacies typically print the medication instructions only on the boxes and not directly on the medication tubes.   If your medication is too expensive, please contact our office at [phone] or send [place] a message through MyChart.   We are unable to tell what your co-pay for medications will be in advance as this is different depending on your insurance coverage. However, we may be able to find a substitute medication at lower cost or fill out paperwork to get insurance to cover a needed medication.   If a prior authorization is required to get your medication covered by your insurance company, please allow [place] 1-2 business days to complete this process.  Drug prices often vary depending on where the prescription is filled and some pharmacies may offer cheaper prices.  The website www.goodrx.com contains coupons for medications through different pharmacies. The prices here do not account for what the cost may be with help from insurance (it may be cheaper with your insurance), but the website can give you the price if you did not use  any insurance.  - You can print the associated coupon and take it with your prescription to the pharmacy.  - You may also stop by our office during regular business hours and pick up a GoodRx coupon card.  - If you need your prescription sent electronically to a different pharmacy, notify our office through [facility] or by phone at [phone]

## 2023-09-18 NOTE — Progress Notes (Signed)
Follow-Up Visit   Subjective  Todd Duncan is a 77 y.o. male who presents for the following: atopic derm  Patient present today for follow up visit for atopic derm. Patient was last evaluated on 05/18/23. Rx Tacrolimus 0.1% ointment - BID to hands, elbows feet and face - 2 weeks then 1 week off. Patient reports sxs are better. Patient denies medication changes.  Todd Duncan presents today with a chief complaint of a mild flare of eczema on his feet, particularly on the plantar surface and right lateral malleolus. He reports that his hands are currently clear and doing well. He has been using tacrolimus cream once daily for his eczema, which has been effective in controlling his symptoms. However, he experiences occasional itching, which occurs sometimes but not constantly.  The patient has not been working at [employer] recently and has decided to let go of that job. He is currently looking for other employment opportunities. He has previously discussed the possibility of using Dupixent injections for his eczema but was initially hesitant due to the idea of taking injections. However, he is now considering this option as a potential alternative to the creams and ointments he has been using  The following portions of the chart were reviewed this encounter and updated as appropriate: medications, allergies, medical history  Review of Systems:  No other skin or systemic complaints except as noted in HPI or Assessment and Plan.  Objective  Well appearing patient in no apparent distress; mood and affect are within normal limits.   A focused examination was performed of the following areas: bilateral feet   Relevant exam findings are noted in the Assessment and Plan.       Assessment & Plan   ATOPIC DERMATITIS / Pruritus Exam: Scaly pink papules coalescing to plaques. Hands are clear. Feet are improved but mild flared at dorsal surface.  Improved but still flaring BSA: 5%,  (hands and feet involved),  IGA: 2-3   - Assessment: Patient presents with a mild flare of eczema, primarily affecting the feet. Notable findings include an improved condition of the feet overall, with a mild flare noted on the dorsal or plantar surface. An examinative scaling plaque is observed on the right lateral malleolus. The hands are clear, showing no active scaling or inflammation. The patient reports intermittent itching. - Plan:   - Prescribe clotrimazole betamethasone cream for the acute flare on feet / ankle   - Instruct patient to apply clotrimazole betamethasone twice daily for 2 weeks.   - After 2 weeks, advise the patient to return to using tacrolimus once daily.   - Submit paperwork for insurance approval of Dupixent (dupilumab).   - Once Dupixent is approved, schedule an appointment to initiate Dupixent injections.   - Continue the current regimen with tacrolimus until Dupixent is approved and initiated.   - Educate patient on Dupixent: biweekly injections, self-administered at home, with the potential for long-term use (a couple of years).  Plan: Dupixent Initiation Indications:  Patient isn't a candidate for systemic therapy with methotrexate or cyclosporine. Patient has been unresponsive to aggressive topical therapy.  Failed Treatments: Topical Steroids and Topical Protopic  Treatment Protocol: 600 mg [place] day 0 then 300 mg [place] every other week  Specific Contraindications Cyclosporine is contraindicated because the patient will not be able to complete the necessary follow-up labs. Methotrexate is contraindicated because the patient will not be able to complete the necessary follow-up labs. Phototherapy is contraindicated because the patient lives too  far from the treatment location.   Dupixent Counseling: I discussed with the patient the risks of dupilumab including but not limited to eye infection and irritation, cold sores, injection site reactions, worsening of  asthma, allergic reactions and increased risk of parasitic infection. Live vaccines should be avoided while taking dupilumab. Dupilumab will also interact with certain medications such as warfarin and cyclosporine. The patient understands that monitoring is required and they must alert [place] or the primary physician if symptoms of infection or other concerning signs are noted.  Dupixent Monitoring: There is no laboratory monitoring requirement with Dupixent.      Follow-up in 3 about  weeks, we will call for appointment to start sample  once Dupixent is approved     No follow-ups on file.    Documentation: I have reviewed the above documentation for accuracy and completeness, and I agree with the above.   I, Shirron Marcha Solders, CMA, am acting as scribe for [employer], DO.   Langston Reusing, DO

## 2023-09-20 ENCOUNTER — Other Ambulatory Visit: Payer: Self-pay

## 2023-09-20 DIAGNOSIS — L209 Atopic dermatitis, unspecified: Secondary | ICD-10-CM

## 2023-09-20 MED ORDER — DUPIXENT 300 MG/2ML ~~LOC~~ SOAJ: 600.0000 mg | Freq: Once | SUBCUTANEOUS | 0 refills | Status: AC

## 2023-09-20 MED ORDER — DUPIXENT 300 MG/2ML ~~LOC~~ SOAJ: 300.0000 mg | SUBCUTANEOUS | 3 refills | Status: DC

## 2023-09-21 ENCOUNTER — Other Ambulatory Visit: Payer: Self-pay

## 2023-09-21 DIAGNOSIS — L209 Atopic dermatitis, unspecified: Secondary | ICD-10-CM

## 2023-09-21 MED ORDER — DUPIXENT 300 MG/2ML ~~LOC~~ SOAJ: 300.0000 mg | SUBCUTANEOUS | 3 refills | Status: AC

## 2023-11-14 NOTE — Congregational Nurse Program (Signed)
  Dept: [phone]   Congregational Nurse Program Note  Date of Encounter: 11/14/2023  Past Medical History: Past Medical History:  Diagnosis Date   Asthma    since I was born (2018/04/26)   Family history of adverse reaction to anesthesia    sister operated on in 1971-09-30; she died; she was pregnant (04/26/2018)   Hemoptysis 04/07/2018    Encounter Details:  Community Questionnaire - 11/14/23 1240       Questionnaire   Ask client: Do you give verbal consent for me to treat you today? Yes    Student Assistance N/A    Location Patient Served  [facility]    Encounter Setting CN site    Population Status Migrant/Refugee    Insurance Medicaid    Insurance/Financial Assistance Referral Medicaid    Medication Have Medication Insecurities;Provided Medication Assistance    Medical Provider Yes    Screening Referrals Made N/A    Medical Referrals Made N/A    Medical Appointment Completed [facility]    CNP Interventions Advocate/Support;Navigate Healthcare System;Case Management;Educate;Counsel    Screenings CN Performed Blood Pressure    ED Visit Averted Yes    Life-Saving Intervention Made N/A            Patient requesting address for appointment tomorrow. Address provided. He has transportation.  Naomie Reynoldo Mainer RN BSN PCCN  [facility] [phone]-cell [phone]-office

## 2023-11-15 ENCOUNTER — Encounter (INDEPENDENT_AMBULATORY_CARE_PROVIDER_SITE_OTHER): Payer: Self-pay | Admitting: Primary Care

## 2023-11-15 ENCOUNTER — Ambulatory Visit (INDEPENDENT_AMBULATORY_CARE_PROVIDER_SITE_OTHER): Payer: Medicaid Other | Admitting: Primary Care

## 2023-11-15 VITALS — BP 116/75 | HR 73 | Resp 16 | Ht 69.0 in | Wt 122.8 lb

## 2023-11-15 DIAGNOSIS — Z2821 Immunization not carried out because of patient refusal: Secondary | ICD-10-CM

## 2023-11-15 DIAGNOSIS — Z0000 Encounter for general adult medical examination without abnormal findings: Secondary | ICD-10-CM | POA: Diagnosis not present

## 2023-11-15 NOTE — Patient Instructions (Signed)
Vitamin B12 and Folate Test Why am I having this test? Vitamin B12 and folate (folic acid) are B vitamins needed to make red blood cells and keep your nervous system healthy. Vitamin B12 is in foods such as meats, eggs, dairy products, and fish. Folate is in fruits, beans, and leafy green vegetables. Some foods, such as whole grains, bread, and cereals have vitamin B12 added to them (are fortified). You may not have enough of these B vitamins (have a deficiency) if your diet lacks these vitamins. Low levels can also be caused by diseases or having had surgeries on your stomach or small intestine that interfere with your ability to absorb the vitamins from your food. You may have a vitamin B12 and folate test if: You have symptoms of vitamin B12 or folate deficiency, such as tiredness (fatigue), headache, confusion, poor balance, or tingling and numbness in your hands and feet. You are pregnant or breastfeeding. Women who are pregnant or breastfeeding need more folate and may need to take dietary supplements. Your red blood cell count is low (anemia). You are an older person and have mental confusion. You have a disease or condition that may lead to a deficiency of these B vitamins. What is being tested? This test measures the amount of vitamin B12 and folate in your blood. The tests for vitamin B12 and folate may be done together or separately. What kind of sample is taken?  A blood sample is required for this test. It is usually collected by inserting a needle into a blood vessel. How do I prepare for this test? Follow instructions from your health care provider about eating and drinking before the test. Tell a health care provider about: All medicines you are taking, including vitamins, herbs, eye drops, creams, and over-the-counter medicines. Any medical conditions you have. Whether you are pregnant or may be pregnant. How often you drink alcohol. How are the results reported? Your test  results will be reported as values that identify the amount of vitamin B12 and folate in your blood. Your health care provider will compare your results to normal ranges that were established after testing a large group of people (reference ranges). Reference ranges may vary among labs and hospitals. For this test, common reference ranges are: Vitamin B12: 160-950 pg/mL or 118-701 pmol/L (SI units). Folate: 5-25 ng/mL or 11-57 nmol/L (SI units). What do the results mean? Results within the reference range are considered normal. Vitamin B12 or folate levels that are lower than the reference range may be caused by: Poor nutrition or eating a vegetarian or vegan diet that does not include any foods that come from animals. Having alcoholism. Having certain diseases that make it hard to absorb vitamin B12. These diseases include Crohn's disease, chronic pancreatitis, and cystic fibrosis. Taking certain medicines. Having had surgeries on your stomach or small intestine. High levels of vitamin B12 are rare, but they may happen if you have: Cancer. Liver disease. High levels of folate may happen if: You have anemia. You are vegetarian. You have had a recent blood transfusion. Talk with your health care provider about what your results mean. Questions to ask your health care provider Ask your health care provider, or the department that is doing the test: When will my results be ready? How will I get my results? What are my treatment options? What other tests do I need? What are my next steps? Summary Vitamin B12 and folate (folic acid) are both B vitamins that are needed to  make red blood cells and to keep your nervous system healthy. You may not have enough B vitamins in your body if you do not get enough in your diet or if you have a disease that makes it hard to absorb vitamin B12. This test measures the amount of vitamin B12 and folate in your blood. A blood sample is required for the  test. Talk with your health care provider about what your results mean. This information is not intended to replace advice given to you by your health care provider. Make sure you discuss any questions you have with your health care provider. Document Revised: 05/21/2021 Document Reviewed: 05/21/2021 Elsevier Patient Education  2024 [employer].

## 2023-11-18 ENCOUNTER — Encounter (INDEPENDENT_AMBULATORY_CARE_PROVIDER_SITE_OTHER): Payer: Self-pay | Admitting: Primary Care

## 2023-11-18 NOTE — Progress Notes (Signed)
 [facility]  Todd Duncan, is a 78 y.o. male  RDW:[id number]  FMW:[id number]  DOB - October 09, 1946 None       Subjective:   Todd Duncan is a 78 y.o. male here today for a route visit. Patient has No headache, No chest pain, No abdominal pain - No Nausea, No new weakness tingling or numbness, No Cough - shortness of breath HPI  No problems updated.  Comprehensive ROS Pertinent positive and negative noted in HPI   No Known Allergies  Past Medical History:  Diagnosis Date   Asthma    since I was born (05/09/18)   Family history of adverse reaction to anesthesia    sister operated on in 15-Oct-1971; she died; she was pregnant (May 09, 2018)   Hemoptysis 04/07/2018    Current Outpatient Medications on File Prior to Visit  Medication Sig Dispense Refill   clotrimazole -betamethasone  (LOTRISONE ) cream Apply 1 Application topically 2 (two) times daily. Apply to hands and feet for 2 months 45 g 1   Dupilumab  (DUPIXENT ) 300 MG/2ML SOAJ Inject 300 mg into the skin every 14 (fourteen) days. Starting at day 15 for maintenance. 4 mL 3   fluconazole  (DIFLUCAN ) 200 MG tablet Take 1 tablet (200 mg total) by mouth once a week. 4 tablet 0   tacrolimus  (PROTOPIC ) 0.1 % ointment Apply twice daily to hands, elbows, feet and face for 2 weeks then take 1 week break and then restart 100 g 3   triamcinolone  0.1% oint-Eucerin equivalent cream 1:1 mixture Apply topically 2 (two) times daily as needed. 100 g 0   No current facility-administered medications on file prior to visit.   Health Maintenance  Topic Date Due   COVID-19 Vaccine (3 - Pfizer risk series) 01/21/2020   Zoster (Shingles) Vaccine (1 of 2) 02/12/2024*   Pneumonia Vaccine (1 of 2 - PCV) 11/14/2024*   DTaP/Tdap/Td vaccine (5 - Td or Tdap) 10/13/2027   Flu Shot  Completed   Hepatitis C Screening  Completed   HPV Vaccine  Aged Out  *Topic was postponed. The date shown is not the original due date.    Objective:   Vitals:    11/15/23 1020  BP: 116/75  Pulse: 73  Resp: 16  SpO2: 98%  Weight: 122 lb 12.8 oz (55.7 kg)  Height: 5' 9 (1.753 m)   BP Readings from Last 3 Encounters:  11/15/23 116/75  06/13/23 122/75  05/18/23 106/70      Physical Exam Vitals reviewed.  HENT:     Head: Normocephalic.     Right Ear: Tympanic membrane and external ear normal.     Left Ear: Tympanic membrane and external ear normal.  Eyes:     Extraocular Movements: Extraocular movements intact.  Cardiovascular:     Pulses: Normal pulses.     Heart sounds: Normal heart sounds.  Pulmonary:     Effort: Pulmonary effort is normal.     Breath sounds: Normal breath sounds.  Abdominal:     General: Abdomen is flat. Bowel sounds are normal.     Palpations: Abdomen is soft.  Skin:    General: Skin is warm.  Neurological:     Mental Status: He is alert and oriented to person, place, and time.  Psychiatric:        Mood and Affect: Mood normal.        Behavior: Behavior normal.        Thought Content: Thought content normal.    Assessment & Plan  Diagnoses and all  orders for this visit:  Routine general medical examination at a health care facility  Herpes zoster vaccination declined  Pneumococcal vaccination declined     Patient have been counseled extensively about nutrition and exercise. Other issues discussed during this visit include: low cholesterol diet, weight control and daily exercise, foot care, annual eye examinations at Ophthalmology, importance of adherence with medications and regular follow-up. We also discussed long term complications of uncontrolled diabetes and hypertension.   Return for annual physical.  The patient was given clear instructions to go to ER or return to medical center if symptoms don't improve, worsen or new problems develop. The patient verbalized understanding. The patient was told to call to get lab results if they haven't heard anything in the next week.   This note has  been created with Education officer, environmental. Any transcriptional errors are unintentional.   Todd SHAUNNA Bohr, NP 11/18/2023, 10:29 PM

## 2024-01-30 NOTE — Congregational Nurse Program (Signed)
  Dept: [phone]   Congregational Nurse Program Note  Date of Encounter: 01/30/2024  Past Medical History: Past Medical History:  Diagnosis Date   Asthma    "since I was born" (May 11, 2018)   Family history of adverse reaction to anesthesia    "sister operated on in 03-08-1971; she died; she was pregnant" (2018-05-11)   Hemoptysis 04/07/2018    Encounter Details:  Community Questionnaire - 01/30/24 1320       Questionnaire   Ask client: Do you give verbal consent for me to treat you today? Yes    Student Assistance [employer]    [facility] Patient Served  [facility]    Encounter Setting CN site    Population Status Migrant/Refugee    Insurance Medicaid    Insurance/Financial Assistance Referral Medicaid    Medication Have Medication Insecurities;Provided Medication Assistance    Medical Provider Yes    Screening Referrals Made N/A    Medical Referrals Made N/A    Medical Appointment Completed [facility]    CNP Interventions Advocate/Support;Navigate Healthcare System;Case Management;Educate;Counsel    Screenings CN Performed Blood Pressure    ED Visit Averted Yes    Life-Saving Intervention Made N/A            Patient came in for blood pressure check, BP 129/81 HR 74. He is requesting information regarding citizenship process. Referred to case manager Carolynne Citron.   Perla Bradford Jayvon Mounger RN BSN PCCN  [facility] [phone]-cell [phone]-office

## 2024-04-09 NOTE — Congregational Nurse Program (Signed)
  Dept: [phone]   Congregational Nurse Program Note  Date of Encounter: 04/09/2024  Past Medical History: Past Medical History:  Diagnosis Date   Asthma    since I was born (04-28-2018)   Family history of adverse reaction to anesthesia    sister operated on in 05-03-71; she died; she was pregnant (28-Apr-2018)   Hemoptysis 04/07/2018    Encounter Details:  Community Questionnaire - 04/09/24 1114       Questionnaire   Ask client: Do you give verbal consent for me to treat you today? Yes    Student Assistance Medical Student    Location Patient Served  [facility]    Encounter Setting CN site    Population Status Migrant/Refugee    Insurance Medicaid    Insurance/Financial Assistance Referral Medicaid    Medication Have Medication Insecurities;Provided Medication Assistance    Medical Provider Yes    Screening Referrals Made N/A    Medical Referrals Made N/A    Medical Appointment Completed [facility]    CNP Interventions Advocate/Support;Navigate Healthcare System;Case Management;Educate;Counsel    Screenings CN Performed Blood Pressure    ED Visit Averted Yes    Life-Saving Intervention Made N/A          Patient brought in letter from [facility] stating that he was overpaid during COVID, he is seeking assistance to understand the letter. Referred to case manager here at [employer].  Naomie Jamarri Vuncannon RN BSN PCCN  [facility] [phone]-cell [phone]-office

## 2024-04-17 ENCOUNTER — Ambulatory Visit (HOSPITAL_COMMUNITY)
Admission: EM | Admit: 2024-04-17 | Discharge: 2024-04-17 | Disposition: A | Discharge status: Home / Self Care | Payer: Self-pay | Attending: Family Medicine | Admitting: Family Medicine

## 2024-04-17 ENCOUNTER — Encounter (HOSPITAL_COMMUNITY): Payer: Self-pay

## 2024-04-17 DIAGNOSIS — M545 Low back pain, unspecified: Secondary | ICD-10-CM

## 2024-04-17 LAB — POCT URINALYSIS DIP (MANUAL ENTRY)
Bilirubin, UA: NEGATIVE
Blood, UA: NEGATIVE
Glucose, UA: NEGATIVE mg/dL
Ketones, POC UA: NEGATIVE mg/dL
Leukocytes, UA: NEGATIVE
Nitrite, UA: NEGATIVE
Protein Ur, POC: NEGATIVE mg/dL
Spec Grav, UA: 1.01 (ref 1.010–1.025)
Urobilinogen, UA: 0.2 U/dL
pH, UA: 5.5 (ref 5.0–8.0)

## 2024-04-17 LAB — GLUCOSE, POCT (MANUAL RESULT ENTRY): POC Glucose: 116 mg/dL — AB (ref 70–99)

## 2024-04-17 MED ORDER — MELOXICAM 15 MG PO TABS: 15.0000 mg | ORAL_TABLET | Freq: Every day | ORAL | 0 refills | Status: AC

## 2024-04-17 MED ORDER — TIZANIDINE HCL 4 MG PO TABS: 4.0000 mg | ORAL_TABLET | Freq: Three times a day (TID) | ORAL | 0 refills | Status: AC | PRN

## 2024-04-17 NOTE — ED Provider Notes (Signed)
 [facility]   [id number] 04/17/24 Arrival Time: 1210  ASSESSMENT & PLAN:  1. Acute bilateral low back pain without sciatica    U/A normal.  Able to ambulate here and hemodynamically stable. Likely MSK back pain; discussed. Trial of: Meds ordered this encounter  Medications   tiZANidine  (ZANAFLEX ) 4 MG tablet    Sig: Take 1 tablet (4 mg total) by mouth every 8 (eight) hours as needed for muscle spasms.    Dispense:  21 tablet    Refill:  0   meloxicam  (MOBIC ) 15 MG tablet    Sig: Take 1 tablet (15 mg total) by mouth daily.    Dispense:  7 tablet    Refill:  0   Encourage ROM/movement as tolerated.  Recommend:  Follow-up Information     Celestia Rosaline SQUIBB, NP.   Specialty: Internal Medicine Why: If worsening or failing to improve as anticipated. Contact information: [address] [place] [address] [phone]                 Reviewed expectations re: course of current medical issues. Questions answered. Outlined signs and symptoms indicating need for more acute intervention. Patient verbalized understanding. After Visit Summary given.   SUBJECTIVE: History from: patient. Kiswahili interpreter used. Todd Duncan is a 78 y.o. male who presents with complaint of bilateral LBP; x several days; h/o similar; intermittent in nature; does not radiate. Denies extremity sensation changes or weakness. Denies changes in bowel/bladder habits. No tx PTA. Denies fever/abd pain.   OBJECTIVE:  Vitals:   04/17/24 1319  BP: 94/65  Pulse: 68  Resp: 16  Temp: 98.3 F (36.8 C)  TempSrc: Oral  SpO2: 95%    General appearance: alert; no distress HEENT: [place]; AT Neck: supple with FROM; without midline tenderness CV: regular Lungs: unlabored respirations; speaks full sentences without difficulty Abdomen: soft, non-tender; non-distended Back: mild  and poorly localized tenderness to palpation over bilateral lumbar paraspinal musculature; FROM at  waist; bruising: none; without midline tenderness Extremities: without edema; symmetrical without gross deformities; normal ROM of bilateral LE Skin: warm and dry Neurologic: normal gait; normal sensation and strength of bilateral LE Psychological: alert and cooperative; normal mood and affect  Labs: Results for orders placed or performed during the hospital encounter of 04/17/24  POC urinalysis dipstick   Collection Time: 04/17/24  2:06 PM  Result Value Ref Range   Color, UA yellow yellow   Clarity, UA clear clear   Glucose, UA negative negative mg/dL   Bilirubin, UA negative negative   Ketones, POC UA negative negative mg/dL   Spec Grav, UA 8.989 8.989 - 1.025   Blood, UA negative negative   pH, UA 5.5 5.0 - 8.0   Protein Ur, POC negative negative mg/dL   Urobilinogen, UA 0.2 0.2 or 1.0 E.U./dL   Nitrite, UA Negative Negative   Leukocytes, UA Negative Negative   Labs Reviewed  POCT URINALYSIS DIP (MANUAL ENTRY)    Imaging: No results found.  No Known Allergies  Past Medical History:  Diagnosis Date   Asthma    since I was born (05/01/2018)   Family history of adverse reaction to anesthesia    sister operated on in May 19, 1971; she died; she was pregnant (May 01, 2018)   Hemoptysis 04/07/2018   Social History   Socioeconomic History   Marital status: Married    Spouse name: Not on file   Number of children: 10   Years of education: Not on file   Highest education level:  Not on file  Occupational History   Not on file  Tobacco Use   Smoking status: Every Day    Current packs/day: 0.10    Average packs/day: 0.1 packs/day for 46.0 years (4.6 ttl pk-yrs)    Types: Cigarettes   Smokeless tobacco: Never   Tobacco comments:    04/11/2018 stopped  in 02/2018  Vaping Use   Vaping status: Never Used  Substance and Sexual Activity   Alcohol use: Not Currently    Alcohol/week: 8.0 standard drinks of alcohol    Types: 8 Cans of beer per week    Comment: 04/11/2018 4 beers, 2  days/wk   Drug use: Never   Sexual activity: Yes  Other Topics Concern   Not on file  Social History Narrative   Not on file   Social Drivers of Health   Financial Resource Strain: Not on file  Food Insecurity: No Food Insecurity (11/15/2023)   Hunger Vital Sign    Worried About Running Out of Food in the Last Year: Never true    Ran Out of Food in the Last Year: Never true  Transportation Needs: No Transportation Needs (11/15/2023)   PRAPARE - Administrator, Civil Service (Medical): No    Lack of Transportation (Non-Medical): No  Physical Activity: Not on file  Stress: Not on file  Social Connections: Not on file  Intimate Partner Violence: Not At Risk (11/15/2023)   Humiliation, Afraid, Rape, and Kick questionnaire    Fear of Current or Ex-Partner: No    Emotionally Abused: No    Physically Abused: No    Sexually Abused: No   Family History  Family history unknown: Yes   Past Surgical History:  Procedure Laterality Date   NO PAST SURGERIES     VIDEO BRONCHOSCOPY Bilateral 04/13/2018   Procedure: VIDEO BRONCHOSCOPY WITHOUT FLUORO;  Surgeon: Alaine Vicenta NOVAK, MD;  Location: [facility] ENDOSCOPY;  Service: Cardiopulmonary;  Laterality: Bilateral;      Rolinda Rogue, MD 04/17/24 1539

## 2024-04-17 NOTE — Congregational Nurse Program (Signed)
  Dept: [phone]   Congregational Nurse Program Note  Date of Encounter: 04/17/2024  Past Medical History: Past Medical History:  Diagnosis Date   Asthma    since I was born (2018/04/24)   Family history of adverse reaction to anesthesia    sister operated on in 1971-05-12; she died; she was pregnant (04/24/18)   Hemoptysis 04/07/2018    Encounter Details:  Community Questionnaire - 04/17/24 1137       Questionnaire   Ask client: Do you give verbal consent for me to treat you today? Yes    Student Assistance Medical Student    Location Patient Served  [facility]    Encounter Setting CN site    Population Status Migrant/Refugee    Insurance Medicaid    Insurance/Financial Assistance Referral Medicaid    Medication Have Medication Insecurities;Provided Medication Assistance    Medical Provider Yes    Screening Referrals Made N/A    Medical Referrals Made N/A    Medical Appointment Completed N/A    CNP Interventions Advocate/Support;Navigate Healthcare System;Case Management;Educate;Counsel    Screenings CN Performed Blood Pressure    ED Visit Averted Yes    Life-Saving Intervention Made N/A         Patient is complaining of lower back pain that started four days ago. Would like to be seen in the urgent care. Transportation provided.  Naomie Arielys Wandersee RN BSN PCCN  [facility] [phone]-cell [phone]-office

## 2024-04-17 NOTE — ED Triage Notes (Signed)
 Patient here today with c/o low back pain X 4 days.
